# Patient Record
Sex: Female | Born: 1950 | Race: White | Hispanic: No | Smoking: Never smoker
Health system: Southern US, Community
[De-identification: ages and names within clinical notes are randomized; demographics above are authoritative.]

## PROBLEM LIST (undated history)

## (undated) DIAGNOSIS — H269 Unspecified cataract: Secondary | ICD-10-CM

## (undated) DIAGNOSIS — T7840XA Allergy, unspecified, initial encounter: Secondary | ICD-10-CM

## (undated) DIAGNOSIS — M1990 Unspecified osteoarthritis, unspecified site: Secondary | ICD-10-CM

## (undated) HISTORY — PX: CATARACT EXTRACTION: SUR2

## (undated) HISTORY — PX: EYE SURGERY: SHX253

## (undated) HISTORY — PX: OTHER SURGICAL HISTORY: SHX169

## (undated) HISTORY — DX: Allergy, unspecified, initial encounter: T78.40XA

## (undated) HISTORY — DX: Unspecified osteoarthritis, unspecified site: M19.90

## (undated) HISTORY — DX: Unspecified cataract: H26.9

---

## 2010-08-29 ENCOUNTER — Ambulatory Visit: Payer: Self-pay | Admitting: Internal Medicine

## 2012-04-28 ENCOUNTER — Ambulatory Visit: Payer: Self-pay | Admitting: Sports Medicine

## 2015-06-24 ENCOUNTER — Encounter: Payer: Self-pay | Admitting: Family Medicine

## 2015-06-24 ENCOUNTER — Ambulatory Visit (INDEPENDENT_AMBULATORY_CARE_PROVIDER_SITE_OTHER): Payer: 59 | Admitting: Family Medicine

## 2015-06-24 VITALS — BP 130/70 | HR 82 | Ht 67.0 in | Wt 241.0 lb

## 2015-06-24 DIAGNOSIS — H109 Unspecified conjunctivitis: Secondary | ICD-10-CM | POA: Diagnosis not present

## 2015-06-24 DIAGNOSIS — J0100 Acute maxillary sinusitis, unspecified: Secondary | ICD-10-CM

## 2015-06-24 MED ORDER — AMOXICILLIN 500 MG PO CAPS: 500.0000 mg | ORAL_CAPSULE | Freq: Three times a day (TID) | ORAL | Status: DC

## 2015-06-24 MED ORDER — SULFACETAMIDE SODIUM 10 % OP SOLN: 1.0000 [drp] | OPHTHALMIC | Status: DC

## 2015-06-24 NOTE — Progress Notes (Signed)
Name: Miranda Jimenez   MRN: [id number]    DOB: 1951-07-27   Date:06/24/2015       Progress Note  Subjective  Chief Complaint  Chief Complaint  Patient presents with  . Sinusitis    drainage, congestion, green production  . Conjunctivitis    L) eye red and itching- kids had pink eye    Sinusitis This is a recurrent problem. The current episode started more than 1 year ago. The problem has been gradually worsening since onset. The maximum temperature recorded prior to her arrival was 100.4 - 100.9 F. The fever has been present for less than 1 day. Her pain is at a severity of 5/10. The pain is moderate. Associated symptoms include chills, congestion, coughing, headaches, sinus pressure and a sore throat. Pertinent negatives include no diaphoresis, ear pain, hoarse voice, neck pain or shortness of breath. Past treatments include oral decongestants. The treatment provided mild relief.  Conjunctivitis  The current episode started yesterday. The problem occurs frequently. The problem is mild. Associated symptoms include eye itching, congestion, ear discharge, headaches, sore throat, cough and eye redness. Pertinent negatives include no fever, no abdominal pain, no constipation, no diarrhea, no nausea, no ear pain, no neck pain, no wheezing and no rash.    No problem-specific assessment & plan notes found for this encounter.   Past Medical History  Diagnosis Date  . Allergy     Past Surgical History  Procedure Laterality Date  . Cataract extraction Bilateral   . Ptk surgery      History reviewed. No pertinent family history.  Social History   Social History  . Marital Status: Married    Spouse Name: N/A  . Number of Children: N/A  . Years of Education: N/A   Occupational History  . Not on file.   Social History Main Topics  . Smoking status: Never Smoker   . Smokeless tobacco: Not on file  . Alcohol Use: No  . Drug Use: No  . Sexual Activity: No   Other Topics Concern   . Not on file   Social History Narrative  . No narrative on file    No Known Allergies   Review of Systems  Constitutional: Positive for chills. Negative for fever, weight loss, malaise/fatigue and diaphoresis.  HENT: Positive for congestion, ear discharge, sinus pressure and sore throat. Negative for ear pain and hoarse voice.   Eyes: Positive for redness and itching. Negative for blurred vision.  Respiratory: Positive for cough. Negative for sputum production, shortness of breath and wheezing.   Cardiovascular: Negative for chest pain, palpitations and leg swelling.  Gastrointestinal: Negative for heartburn, nausea, abdominal pain, diarrhea, constipation, blood in stool and melena.  Genitourinary: Negative for dysuria, urgency, frequency and hematuria.  Musculoskeletal: Negative for myalgias, back pain, joint pain and neck pain.  Skin: Negative for rash.  Neurological: Positive for headaches. Negative for dizziness, tingling, sensory change and focal weakness.  Endo/Heme/Allergies: Negative for environmental allergies and polydipsia. Does not bruise/bleed easily.  Psychiatric/Behavioral: Negative for depression and suicidal ideas. The patient is not nervous/anxious and does not have insomnia.      Objective  Filed Vitals:   06/24/15 1419  BP: 130/70  Pulse: 82  Height: 5\' 7"  (1.702 m)  Weight: 241 lb (109.317 kg)    Physical Exam  Constitutional: She is well-developed, well-nourished, and in no distress. No distress.  HENT:  Head: Normocephalic and atraumatic.  Right Ear: External ear normal.  Left Ear: External ear normal.  Nose: Nose normal.  Mouth/Throat: Oropharynx is clear and moist.  Eyes: Conjunctivae and EOM are normal. Pupils are equal, round, and reactive to light. Right eye exhibits no discharge. Left eye exhibits no discharge.  Neck: Normal range of motion. Neck supple. No JVD present. No thyromegaly present.  Cardiovascular: Normal rate, regular rhythm,  normal heart sounds and intact distal pulses.  Exam reveals no gallop and no friction rub.   No murmur heard. Pulmonary/Chest: Effort normal and breath sounds normal.  Abdominal: Soft. Bowel sounds are normal. She exhibits no mass. There is no tenderness. There is no guarding.  Musculoskeletal: Normal range of motion. She exhibits no edema.  Lymphadenopathy:    She has no cervical adenopathy.  Neurological: She is alert. She has normal reflexes.  Skin: Skin is warm and dry. She is not diaphoretic.  Psychiatric: Mood and affect normal.      Assessment & Plan  Problem List Items Addressed This Visit    None    Visit Diagnoses    Acute maxillary sinusitis, recurrence not specified    -  Primary    Relevant Medications    amoxicillin (AMOXIL) 500 MG capsule    Bilateral conjunctivitis        Relevant Medications    sulfacetamide (BLEPH-10) 10 % ophthalmic solution         Dr. [facility]den [facility]  06/24/2015

## 2015-08-14 ENCOUNTER — Ambulatory Visit (INDEPENDENT_AMBULATORY_CARE_PROVIDER_SITE_OTHER): Payer: 59 | Admitting: Family Medicine

## 2015-08-14 ENCOUNTER — Encounter: Payer: Self-pay | Admitting: Family Medicine

## 2015-08-14 ENCOUNTER — Ambulatory Visit
Admission: RE | Admit: 2015-08-14 | Discharge: 2015-08-14 | Disposition: A | Discharge status: Home / Self Care | Payer: 59 | Source: Ambulatory Visit | Attending: Family Medicine | Admitting: Family Medicine

## 2015-08-14 VITALS — BP 130/58 | HR 104 | Temp 99.2°F | Ht 67.0 in | Wt 236.0 lb

## 2015-08-14 DIAGNOSIS — I700 Atherosclerosis of aorta: Secondary | ICD-10-CM | POA: Insufficient documentation

## 2015-08-14 DIAGNOSIS — J40 Bronchitis, not specified as acute or chronic: Secondary | ICD-10-CM | POA: Diagnosis not present

## 2015-08-14 DIAGNOSIS — R918 Other nonspecific abnormal finding of lung field: Secondary | ICD-10-CM | POA: Diagnosis not present

## 2015-08-14 DIAGNOSIS — H109 Unspecified conjunctivitis: Secondary | ICD-10-CM | POA: Diagnosis not present

## 2015-08-14 DIAGNOSIS — R05 Cough: Secondary | ICD-10-CM | POA: Insufficient documentation

## 2015-08-14 MED ORDER — LEVOFLOXACIN 500 MG PO TABS: 500.0000 mg | ORAL_TABLET | Freq: Every day | ORAL | Status: DC

## 2015-08-14 MED ORDER — SULFACETAMIDE SODIUM 10 % OP SOLN: 1.0000 [drp] | OPHTHALMIC | Status: DC

## 2015-08-14 MED ORDER — GUAIFENESIN-CODEINE 100-10 MG/5ML PO SOLN: 5.0000 mL | Freq: Three times a day (TID) | ORAL | Status: DC | PRN

## 2015-08-14 NOTE — Progress Notes (Signed)
Name: Miranda Jimenez   MRN: [id number]    DOB: 11/13/1950   Date:08/14/2015       Progress Note  Subjective  Chief Complaint  Chief Complaint  Patient presents with  . Bronchitis    with cough- green production  . Conjunctivitis    R) eye    Conjunctivitis  The problem occurs occasionally. The problem has been gradually worsening. The problem is moderate. Associated symptoms include a fever, eye itching, cough and URI. Pertinent negatives include no orthopnea, no decreased vision, no double vision, no photophobia, no abdominal pain, no constipation, no diarrhea, no nausea, no congestion, no ear discharge, no ear pain, no headaches, no hearing loss, no mouth sores, no rhinorrhea, no sore throat, no stridor, no swollen glands, no neck pain, no neck stiffness, no wheezing, no rash, no eye discharge, no eye pain and no eye redness.  Cough This is a new problem. The current episode started in the past 7 days. The problem has been gradually worsening. The problem occurs hourly. The cough is productive of purulent sputum. Associated symptoms include a fever, nasal congestion, postnasal drip and shortness of breath. Pertinent negatives include no chest pain, chills, ear pain, eye redness, headaches, heartburn, hemoptysis, myalgias, rash, rhinorrhea, sore throat, weight loss or wheezing. She has tried nothing for the symptoms. The treatment provided no relief. There is no history of environmental allergies.    No problem-specific assessment & plan notes found for this encounter.   Past Medical History  Diagnosis Date  . Allergy     Past Surgical History  Procedure Laterality Date  . Cataract extraction Bilateral   . Ptk surgery      No family history on file.  Social History   Social History  . Marital Status: Married    Spouse Name: N/A  . Number of Children: N/A  . Years of Education: N/A   Occupational History  . Not on file.   Social History Main Topics  . Smoking status:  Never Smoker   . Smokeless tobacco: Not on file  . Alcohol Use: No  . Drug Use: No  . Sexual Activity: No   Other Topics Concern  . Not on file   Social History Narrative    No Known Allergies   Review of Systems  Constitutional: Positive for fever. Negative for chills, weight loss and malaise/fatigue.  HENT: Positive for postnasal drip. Negative for congestion, ear discharge, ear pain, hearing loss, mouth sores, rhinorrhea and sore throat.   Eyes: Positive for itching. Negative for blurred vision, double vision, photophobia, pain, discharge and redness.  Respiratory: Positive for cough and shortness of breath. Negative for hemoptysis, sputum production, wheezing and stridor.   Cardiovascular: Negative for chest pain, palpitations, orthopnea and leg swelling.  Gastrointestinal: Negative for heartburn, nausea, abdominal pain, diarrhea, constipation, blood in stool and melena.  Genitourinary: Negative for dysuria, urgency, frequency and hematuria.  Musculoskeletal: Negative for myalgias, back pain, joint pain and neck pain.  Skin: Negative for rash.  Neurological: Negative for dizziness, tingling, sensory change, focal weakness and headaches.  Endo/Heme/Allergies: Negative for environmental allergies and polydipsia. Does not bruise/bleed easily.  Psychiatric/Behavioral: Negative for depression and suicidal ideas. The patient is not nervous/anxious and does not have insomnia.      Objective  Filed Vitals:   08/14/15 1407  BP: 130/58  Pulse: 104  Temp: 99.2 F (37.3 C)  TempSrc: Oral  Height: 5\' 7"  (1.702 m)  Weight: 236 lb (107.049 kg)  SpO2: 95%  Physical Exam  Constitutional: She is well-developed, well-nourished, and in no distress. No distress.  HENT:  Head: Normocephalic and atraumatic.  Right Ear: External ear normal.  Left Ear: External ear normal.  Nose: Nose normal.  Mouth/Throat: Oropharynx is clear and moist.  Eyes: EOM are normal. Pupils are equal,  round, and reactive to light. Right eye exhibits no discharge. Left eye exhibits no discharge. Right conjunctiva is injected. Left conjunctiva is injected.  Neck: Normal range of motion. Neck supple. No JVD present. No thyromegaly present.  Cardiovascular: Normal rate, regular rhythm, normal heart sounds and intact distal pulses.  Exam reveals no gallop and no friction rub.   No murmur heard. Pulmonary/Chest: Effort normal and breath sounds normal.  Abdominal: Soft. Bowel sounds are normal. She exhibits no mass. There is no tenderness. There is no guarding.  Musculoskeletal: Normal range of motion. She exhibits no edema.  Lymphadenopathy:    She has no cervical adenopathy.  Neurological: She is alert. She has normal reflexes.  Skin: Skin is warm and dry. She is not diaphoretic.  Psychiatric: Mood and affect normal.      Assessment & Plan  Problem List Items Addressed This Visit    None    Visit Diagnoses    Bronchitis    -  Primary    Relevant Medications    levofloxacin (LEVAQUIN) 500 MG tablet    guaiFENesin-codeine 100-10 MG/5ML syrup    Other Relevant Orders    DG Chest 2 View    CBC w/Diff/Platelet    Bilateral conjunctivitis        Relevant Medications    sulfacetamide (BLEPH-10) 10 % ophthalmic solution         Dr. [facility]  08/14/2015

## 2015-08-15 LAB — CBC WITH DIFFERENTIAL/PLATELET
BASOS: 1 %
Basophils Absolute: 0.1 10*3/uL (ref 0.0–0.2)
EOS (ABSOLUTE): 0.1 10*3/uL (ref 0.0–0.4)
EOS: 1 %
HEMATOCRIT: 36.9 % (ref 34.0–46.6)
HEMOGLOBIN: 12.4 g/dL (ref 11.1–15.9)
Immature Grans (Abs): 0 10*3/uL (ref 0.0–0.1)
Immature Granulocytes: 0 %
LYMPHS ABS: 2.6 10*3/uL (ref 0.7–3.1)
Lymphs: 24 %
MCH: 31.9 pg (ref 26.6–33.0)
MCHC: 33.6 g/dL (ref 31.5–35.7)
MCV: 95 fL (ref 79–97)
MONOCYTES: 14 %
Monocytes Absolute: 1.5 10*3/uL — ABNORMAL HIGH (ref 0.1–0.9)
NEUTROS ABS: 6.6 10*3/uL (ref 1.4–7.0)
Neutrophils: 60 %
Platelets: 281 10*3/uL (ref 150–379)
RBC: 3.89 x10E6/uL (ref 3.77–5.28)
RDW: 12.8 % (ref 12.3–15.4)
WBC: 10.9 10*3/uL — ABNORMAL HIGH (ref 3.4–10.8)

## 2015-08-18 ENCOUNTER — Ambulatory Visit (INDEPENDENT_AMBULATORY_CARE_PROVIDER_SITE_OTHER): Payer: 59 | Admitting: Family Medicine

## 2015-08-18 ENCOUNTER — Encounter: Payer: Self-pay | Admitting: Family Medicine

## 2015-08-18 VITALS — BP 138/60 | HR 86 | Temp 98.3°F | Ht 67.0 in | Wt 237.0 lb

## 2015-08-18 DIAGNOSIS — J189 Pneumonia, unspecified organism: Secondary | ICD-10-CM | POA: Diagnosis not present

## 2015-08-18 MED ORDER — ALBUTEROL SULFATE (2.5 MG/3ML) 0.083% IN NEBU: 2.5000 mg | INHALATION_SOLUTION | Freq: Once | RESPIRATORY_TRACT | Status: DC

## 2015-08-18 MED ORDER — ALBUTEROL SULFATE HFA 108 (90 BASE) MCG/ACT IN AERS: 2.0000 | INHALATION_SPRAY | Freq: Four times a day (QID) | RESPIRATORY_TRACT | Status: DC | PRN

## 2015-08-18 NOTE — Progress Notes (Addendum)
Name: Miranda Jimenez   MRN: [id number]    DOB: 01/14/1951   Date:08/18/2015       Progress Note  Subjective  Chief Complaint  Chief Complaint  Patient presents with  . Pneumonia    follow up    Pneumonia There is no chest tightness, cough, difficulty breathing, frequent throat clearing, hemoptysis, hoarse voice, shortness of breath, sputum production or wheezing. This is a chronic problem. The current episode started more than 1 year ago. The problem occurs daily. The problem has been waxing and waning. Pertinent negatives include no chest pain, dyspnea on exertion, ear congestion, ear pain, fever, headaches, heartburn, malaise/fatigue, myalgias, nasal congestion, PND, postnasal drip, rhinorrhea, sore throat or weight loss. Her symptoms are alleviated by beta-agonist. She reports moderate improvement on treatment. There are no known risk factors for lung disease. There is no history of asthma, bronchiectasis, bronchitis, COPD, emphysema or pneumonia.    No problem-specific assessment & plan notes found for this encounter.   Past Medical History  Diagnosis Date  . Allergy     Past Surgical History  Procedure Laterality Date  . Cataract extraction Bilateral   . Ptk surgery      History reviewed. No pertinent family history.  Social History   Social History  . Marital Status: Married    Spouse Name: N/A  . Number of Children: N/A  . Years of Education: N/A   Occupational History  . Not on file.   Social History Main Topics  . Smoking status: Never Smoker   . Smokeless tobacco: Not on file  . Alcohol Use: No  . Drug Use: No  . Sexual Activity: No   Other Topics Concern  . Not on file   Social History Narrative    No Known Allergies   Review of Systems  Constitutional: Negative for fever, chills, weight loss and malaise/fatigue.  HENT: Negative for ear discharge, ear pain, hoarse voice, postnasal drip, rhinorrhea and sore throat.   Eyes: Negative for blurred  vision.  Respiratory: Negative for cough, hemoptysis, sputum production, shortness of breath and wheezing.   Cardiovascular: Negative for chest pain, dyspnea on exertion, palpitations, leg swelling and PND.  Gastrointestinal: Negative for heartburn, nausea, abdominal pain, diarrhea, constipation, blood in stool and melena.  Genitourinary: Negative for dysuria, urgency, frequency and hematuria.  Musculoskeletal: Negative for myalgias, back pain, joint pain and neck pain.  Skin: Negative for rash.  Neurological: Negative for dizziness, tingling, sensory change, focal weakness and headaches.  Endo/Heme/Allergies: Negative for environmental allergies and polydipsia. Does not bruise/bleed easily.  Psychiatric/Behavioral: Negative for depression and suicidal ideas. The patient is not nervous/anxious and does not have insomnia.      Objective  Filed Vitals:   08/18/15 0808  BP: 138/60  Pulse: 86  Temp: 98.3 F (36.8 C)  TempSrc: Oral  Height: 5\' 7"  (1.702 m)  Weight: 237 lb (107.502 kg)  SpO2: 97%    Physical Exam  Constitutional: She is well-developed, well-nourished, and in no distress. No distress.  HENT:  Head: Normocephalic and atraumatic.  Right Ear: External ear normal. A middle ear effusion is present.  Left Ear: External ear normal. A middle ear effusion is present.  Nose: Nose normal.  Mouth/Throat: Oropharynx is clear and moist.  Eyes: Conjunctivae and EOM are normal. Pupils are equal, round, and reactive to light. Right eye exhibits no discharge. Left eye exhibits no discharge.  Neck: Normal range of motion. Neck supple. No JVD present. No thyromegaly present.  Cardiovascular:  Normal rate, regular rhythm, normal heart sounds and intact distal pulses.  Exam reveals no gallop and no friction rub.   No murmur heard. Pulmonary/Chest: Effort normal and breath sounds normal.  Abdominal: Soft. Bowel sounds are normal. She exhibits no mass. There is no tenderness. There is no  guarding.  Musculoskeletal: Normal range of motion. She exhibits no edema.  Lymphadenopathy:    She has no cervical adenopathy.  Neurological: She is alert. She has normal reflexes.  Skin: Skin is warm and dry. She is not diaphoretic.  Psychiatric: Mood and affect normal.      Assessment & Plan  Problem List Items Addressed This Visit    None    Visit Diagnoses    Pneumonia, unspecified laterality, unspecified part of lung    -  Primary    Brea sample    Relevant Medications    albuterol (PROVENTIL HFA;VENTOLIN HFA) 108 (90 BASE) MCG/ACT inhaler    albuterol (PROVENTIL) (2.5 MG/3ML) 0.083% nebulizer solution 2.5 mg         Dr. [facility]  08/18/2015

## 2015-10-03 ENCOUNTER — Ambulatory Visit (INDEPENDENT_AMBULATORY_CARE_PROVIDER_SITE_OTHER): Payer: 59 | Admitting: Family Medicine

## 2015-10-03 ENCOUNTER — Encounter: Payer: Self-pay | Admitting: Family Medicine

## 2015-10-03 VITALS — BP 138/80 | HR 92 | Temp 98.2°F | Ht 67.0 in | Wt 236.0 lb

## 2015-10-03 DIAGNOSIS — J0100 Acute maxillary sinusitis, unspecified: Secondary | ICD-10-CM

## 2015-10-03 DIAGNOSIS — J40 Bronchitis, not specified as acute or chronic: Secondary | ICD-10-CM | POA: Diagnosis not present

## 2015-10-03 MED ORDER — GUAIFENESIN-CODEINE 100-10 MG/5ML PO SOLN: 5.0000 mL | Freq: Three times a day (TID) | ORAL | Status: DC | PRN

## 2015-10-03 MED ORDER — AMOXICILLIN 500 MG PO CAPS: 500.0000 mg | ORAL_CAPSULE | Freq: Three times a day (TID) | ORAL | Status: DC

## 2015-10-03 NOTE — Progress Notes (Signed)
Name: Artist PaisVerna D Bolinger   MRN: [id number]    DOB: 06/20/1951   Date:10/03/2015       Progress Note  Subjective  Chief Complaint  Chief Complaint  Patient presents with  . Sinusitis    cough and cong- yellow production    Sinusitis This is a new problem. The current episode started in the past 7 days. The problem has been gradually worsening since onset. There has been no fever. Her pain is at a severity of 3/10. Associated symptoms include chills, congestion, coughing, diaphoresis, sneezing and a sore throat. Pertinent negatives include no ear pain, headaches, hoarse voice, neck pain, shortness of breath, sinus pressure or swollen glands. Past treatments include acetaminophen and oral decongestants. The treatment provided no relief.  Cough This is a new problem. The current episode started in the past 7 days. The problem has been waxing and waning. The cough is productive of purulent sputum. Associated symptoms include chills, nasal congestion, postnasal drip, a sore throat and wheezing. Pertinent negatives include no chest pain, ear pain, fever, headaches, heartburn, myalgias, rash, shortness of breath or weight loss. She has tried a beta-agonist inhaler for the symptoms. The treatment provided mild relief. There is no history of environmental allergies.    No problem-specific assessment & plan notes found for this encounter.   Past Medical History  Diagnosis Date  . Allergy     Past Surgical History  Procedure Laterality Date  . Cataract extraction Bilateral   . Ptk surgery      History reviewed. No pertinent family history.  Social History   Social History  . Marital Status: Married    Spouse Name: N/A  . Number of Children: N/A  . Years of Education: N/A   Occupational History  . Not on file.   Social History Main Topics  . Smoking status: Never Smoker   . Smokeless tobacco: Not on file  . Alcohol Use: No  . Drug Use: No  . Sexual Activity: No   Other Topics  Concern  . Not on file   Social History Narrative    No Known Allergies   Review of Systems  Constitutional: Positive for chills and diaphoresis. Negative for fever, weight loss and malaise/fatigue.  HENT: Positive for congestion, postnasal drip, sneezing and sore throat. Negative for ear discharge, ear pain, hoarse voice and sinus pressure.   Eyes: Negative for blurred vision.  Respiratory: Positive for cough and wheezing. Negative for sputum production and shortness of breath.   Cardiovascular: Negative for chest pain, palpitations and leg swelling.  Gastrointestinal: Negative for heartburn, nausea, abdominal pain, diarrhea, constipation, blood in stool and melena.  Genitourinary: Negative for dysuria, urgency, frequency and hematuria.  Musculoskeletal: Negative for myalgias, back pain, joint pain and neck pain.  Skin: Negative for rash.  Neurological: Negative for dizziness, tingling, sensory change, focal weakness and headaches.  Endo/Heme/Allergies: Negative for environmental allergies and polydipsia. Does not bruise/bleed easily.  Psychiatric/Behavioral: Negative for depression and suicidal ideas. The patient is not nervous/anxious and does not have insomnia.      Objective  Filed Vitals:   10/03/15 1132  BP: 138/80  Pulse: 92  Temp: 98.2 F (36.8 C)  TempSrc: Oral  Height: 5\' 7"  (1.702 m)  Weight: 236 lb (107.049 kg)  SpO2: 98%    Physical Exam  Constitutional: She is well-developed, well-nourished, and in no distress. No distress.  HENT:  Head: Normocephalic and atraumatic.  Right Ear: External ear normal.  Left Ear: External ear normal.  Nose: Nose normal.  Mouth/Throat: Oropharynx is clear and moist.  Eyes: Conjunctivae and EOM are normal. Pupils are equal, round, and reactive to light. Right eye exhibits no discharge. Left eye exhibits no discharge.  Neck: Normal range of motion. Neck supple. No JVD present. No thyromegaly present.  Cardiovascular: Normal  rate, regular rhythm, normal heart sounds and intact distal pulses.  Exam reveals no gallop and no friction rub.   No murmur heard. Pulmonary/Chest: Effort normal and breath sounds normal. She has no wheezes. She has no rales.  Abdominal: Soft. Bowel sounds are normal. She exhibits no mass. There is no tenderness. There is no guarding.  Musculoskeletal: Normal range of motion. She exhibits no edema.  Lymphadenopathy:    She has no cervical adenopathy.  Neurological: She is alert. She has normal reflexes.  Skin: Skin is warm and dry. She is not diaphoretic.  Psychiatric: Mood and affect normal.      Assessment & Plan  Problem List Items Addressed This Visit    None    Visit Diagnoses    Acute maxillary sinusitis, recurrence not specified    -  Primary    Relevant Medications    amoxicillin (AMOXIL) 500 MG capsule    guaiFENesin-codeine 100-10 MG/5ML syrup    Bronchitis        Relevant Medications    amoxicillin (AMOXIL) 500 MG capsule    guaiFENesin-codeine 100-10 MG/5ML syrup         Dr. [facility]  10/03/2015

## 2015-10-17 ENCOUNTER — Other Ambulatory Visit: Payer: Self-pay

## 2015-10-17 DIAGNOSIS — B379 Candidiasis, unspecified: Secondary | ICD-10-CM

## 2015-10-17 MED ORDER — FLUCONAZOLE 150 MG PO TABS: 150.0000 mg | ORAL_TABLET | Freq: Once | ORAL | Status: DC

## 2015-11-16 ENCOUNTER — Encounter: Payer: Self-pay | Admitting: Gynecology

## 2015-11-16 ENCOUNTER — Ambulatory Visit
Admission: EM | Admit: 2015-11-16 | Discharge: 2015-11-16 | Disposition: A | Discharge status: Home / Self Care | Payer: 59 | Attending: Family Medicine | Admitting: Family Medicine

## 2015-11-16 ENCOUNTER — Ambulatory Visit (INDEPENDENT_AMBULATORY_CARE_PROVIDER_SITE_OTHER): Payer: 59

## 2015-11-16 DIAGNOSIS — H65191 Other acute nonsuppurative otitis media, right ear: Secondary | ICD-10-CM

## 2015-11-16 DIAGNOSIS — J029 Acute pharyngitis, unspecified: Secondary | ICD-10-CM | POA: Diagnosis not present

## 2015-11-16 DIAGNOSIS — J069 Acute upper respiratory infection, unspecified: Secondary | ICD-10-CM | POA: Diagnosis not present

## 2015-11-16 MED ORDER — PREDNISONE 20 MG PO TABS: ORAL_TABLET | ORAL | Status: DC

## 2015-11-16 MED ORDER — LEVALBUTEROL HCL 1.25 MG/0.5ML IN NEBU
1.2500 mg | INHALATION_SOLUTION | Freq: Once | RESPIRATORY_TRACT | Status: AC
  Administered 2015-11-16: 1.25 mg via RESPIRATORY_TRACT

## 2015-11-16 MED ORDER — HYDROCOD POLST-CPM POLST ER 10-8 MG/5ML PO SUER: 5.0000 mL | Freq: Two times a day (BID) | ORAL | Status: DC | PRN

## 2015-11-16 NOTE — ED Provider Notes (Addendum)
CSN: [id number]     Arrival date & time 11/16/15  1558 History   First MD Initiated Contact with Patient 11/16/15 1647     Chief Complaint  Patient presents with  . Cough   (Consider location/radiation/quality/duration/timing/severity/associated sxs/prior Treatment) HPI: Patient complains of cough for the last 2 weeks. Patient was prescribed prednisone 6 days and Levaquin 7 days. Patient still states that her coughing has not improved much after taking the meds. Patient denies any chest pain or increased shortness of breath. Patient denies any calf tenderness or swelling. Other people in the household also have similar symptoms. She does have an albuterol inhaler but has not used it. She has been using some over-the-counter counter cough medicine with little improvement. She denies smoking history or asthma history.  Past Medical History  Diagnosis Date  . Allergy    Past Surgical History  Procedure Laterality Date  . Cataract extraction Bilateral   . Ptk surgery     No family history on file. Social History  Substance Use Topics  . Smoking status: Never Smoker   . Smokeless tobacco: None  . Alcohol Use: No   OB History    No data available     Review of Systems: Negative except mentioned above.   Allergies  Review of patient's allergies indicates no known allergies.  Home Medications   Prior to Admission medications   Medication Sig Start Date End Date Taking? Authorizing Provider  albuterol (PROVENTIL HFA;VENTOLIN HFA) 108 (90 BASE) MCG/ACT inhaler Inhale 2 puffs into the lungs every 6 (six) hours as needed for wheezing or shortness of breath. 08/18/15  Yes [name], MD  fluconazole (DIFLUCAN) 150 MG tablet Take 1 tablet (150 mg total) by mouth once. 10/17/15  Yes [name], MD  amoxicillin (AMOXIL) 500 MG capsule Take 1 capsule (500 mg total) by mouth 3 (three) times daily. 10/03/15   [name], MD  guaiFENesin-codeine 100-10 MG/5ML syrup Take 5 mLs by mouth  3 (three) times daily as needed. 10/03/15   [name], MD   Meds Ordered and Administered this Visit  Medications - No data to display  BP 132/72 mmHg  Pulse 87  Temp(Src) 97.8 F (36.6 C) (Oral)  Resp 16  Ht  (1.702 m)  Wt 235 lb (106.595 kg)  BMI 36.80 kg/m2  SpO2 94% No data found.    Physical Exam   GENERAL: NAD HEENT: no pharyngeal erythema, no exudate, no erythema of TMs, no cervical LAD RESP: CTA B, no accessory muscle use CARD: RRR NEURO: CN II-XII grossly intact   ED Course  Procedures (including critical care time)  Labs Review Labs Reviewed - No data to display  Imaging Review No results found.   MDM  URI- Chest x-ray was done which does not show any acute process, patient is to take her last dose of Levaquin and prednisone, Xopenex treatment was given to patient here in the office with some improvement and pulse ox came up to 96%, recommend that patient follow up with her primary care physician if symptoms persist or worsen, will prescribe her Tussionex when necessary for cough, discussed risk of drowsiness, would also try albuterol inhaler as needed and oral antihistamine daily, will prescribe a few more days of oral Prednisone as well.    Jolene Provost, MD 11/16/15 1728  Jolene Provost, MD 11/16/15 [phone]

## 2015-11-16 NOTE — Discharge Instructions (Signed)
Take cough medication as needed. Continue Claritin or Zyrtec. Try Albuterol Inhaler as needed. Seek medical attention if symptoms persist/worsen.

## 2015-11-16 NOTE — ED Notes (Signed)
Patient c/o cough for about 2 weeks now. Per patient call MD online x 6 days ago and was given Rx. Prednisone x 6 days and Levaquin x 7 days. Per patient coughing not getting any better.

## 2016-08-31 ENCOUNTER — Other Ambulatory Visit: Payer: Self-pay

## 2016-09-01 ENCOUNTER — Ambulatory Visit (INDEPENDENT_AMBULATORY_CARE_PROVIDER_SITE_OTHER): Payer: 59 | Admitting: Family Medicine

## 2016-09-01 ENCOUNTER — Encounter: Payer: Self-pay | Admitting: Family Medicine

## 2016-09-01 VITALS — BP 120/74 | HR 84 | Ht 67.0 in | Wt 240.0 lb

## 2016-09-01 DIAGNOSIS — H1033 Unspecified acute conjunctivitis, bilateral: Secondary | ICD-10-CM

## 2016-09-01 DIAGNOSIS — H6503 Acute serous otitis media, bilateral: Secondary | ICD-10-CM | POA: Diagnosis not present

## 2016-09-01 DIAGNOSIS — B001 Herpesviral vesicular dermatitis: Secondary | ICD-10-CM | POA: Diagnosis not present

## 2016-09-01 MED ORDER — SULFACETAMIDE SODIUM 10 % OP SOLN: 1.0000 [drp] | OPHTHALMIC | 1 refills | Status: DC

## 2016-09-01 MED ORDER — SULFACETAMIDE SODIUM 10 % OP SOLN: 1.0000 [drp] | OPHTHALMIC | Status: DC

## 2016-09-01 NOTE — Progress Notes (Signed)
Name: Miranda Jimenez   MRN: [id number]    DOB: 01/18/1951   Date:09/01/2016       Progress Note  Subjective  Chief Complaint  Chief Complaint  Patient presents with  . Conjunctivitis    woke up Tuesday am with matted eye- itchy and red L) eye    Conjunctivitis   The current episode started 2 days ago. The onset was gradual. The problem has been gradually worsening. The problem is mild. Associated symptoms include eye itching, eye discharge and eye redness. Pertinent negatives include no fever, no decreased vision, no double vision, no photophobia, no abdominal pain, no constipation, no diarrhea, no nausea, no congestion, no ear discharge, no ear pain, no headaches, no hearing loss, no mouth sores, no rhinorrhea, no sore throat, no stridor, no swollen glands, no neck pain, no cough, no wheezing, no rash and no eye pain.    No problem-specific Assessment & Plan notes found for this encounter.   Past Medical History:  Diagnosis Date  . Allergy     Past Surgical History:  Procedure Laterality Date  . CATARACT EXTRACTION Bilateral   . ptk surgery      History reviewed. No pertinent family history.  Social History   Social History  . Marital status: Married    Spouse name: N/A  . Number of children: N/A  . Years of education: N/A   Occupational History  . Not on file.   Social History Main Topics  . Smoking status: Never Smoker  . Smokeless tobacco: Not on file  . Alcohol use No  . Drug use: No  . Sexual activity: No   Other Topics Concern  . Not on file   Social History Narrative  . No narrative on file    No Known Allergies   Review of Systems  Constitutional: Negative for chills, fever, malaise/fatigue and weight loss.  HENT: Negative for congestion, ear discharge, ear pain, hearing loss, mouth sores, rhinorrhea and sore throat.   Eyes: Positive for discharge, redness and itching. Negative for blurred vision, double vision, photophobia and pain.   Respiratory: Negative for cough, sputum production, shortness of breath, wheezing and stridor.   Cardiovascular: Negative for chest pain, palpitations and leg swelling.  Gastrointestinal: Negative for abdominal pain, blood in stool, constipation, diarrhea, heartburn, melena and nausea.  Genitourinary: Negative for dysuria, frequency, hematuria and urgency.  Musculoskeletal: Negative for back pain, joint pain, myalgias and neck pain.  Skin: Negative for rash.  Neurological: Negative for dizziness, tingling, sensory change, focal weakness and headaches.  Endo/Heme/Allergies: Negative for environmental allergies and polydipsia. Does not bruise/bleed easily.  Psychiatric/Behavioral: Negative for depression and suicidal ideas. The patient is not nervous/anxious and does not have insomnia.      Objective  Vitals:   09/01/16 0830  BP: 120/74  Pulse: 84  Weight: 240 lb (108.9 kg)  Height: 5\' 7"  (1.702 m)    Physical Exam  Constitutional: She is well-developed, well-nourished, and in no distress. No distress.  HENT:  Head: Normocephalic and atraumatic.  Right Ear: External ear and ear canal normal. A middle ear effusion is present.  Left Ear: External ear and ear canal normal. A middle ear effusion is present.  Nose: Nose normal.  Mouth/Throat: Oropharynx is clear and moist.  Eyes: EOM are normal. Pupils are equal, round, and reactive to light. Right eye exhibits discharge. Left eye exhibits discharge. Right conjunctiva is injected. Left conjunctiva is injected.  Neck: Normal range of motion. Neck supple. No JVD  present. No thyromegaly present.  Cardiovascular: Normal rate, regular rhythm, normal heart sounds and intact distal pulses.  Exam reveals no gallop and no friction rub.   No murmur heard. Pulmonary/Chest: Effort normal and breath sounds normal. She has no wheezes. She has no rales.  Abdominal: Soft. Bowel sounds are normal. She exhibits no mass. There is no tenderness. There is  no guarding.  Musculoskeletal: Normal range of motion. She exhibits no edema.  Lymphadenopathy:    She has no cervical adenopathy.  Neurological: She is alert. She has normal reflexes.  Skin: Skin is warm and dry. She is not diaphoretic.  Psychiatric: Mood and affect normal.  Nursing note and vitals reviewed.     Assessment & Plan  Problem List Items Addressed This Visit    None    Visit Diagnoses    Acute conjunctivitis of both eyes, unspecified acute conjunctivitis type    -  Primary   Relevant Medications   sulfacetamide (BLEPH-10) 10 % ophthalmic solution 1 drop   Bilateral acute serous otitis media, recurrence not specified       mucinex d   Herpes labialis            Dr. Hayden Rasmusseneanna Jones [facility]  09/01/16

## 2016-09-01 NOTE — Patient Instructions (Signed)

## 2016-09-01 NOTE — Addendum Note (Signed)
Addended by: Everitt AmberLYNCH, TARA L on: 09/01/2016 09:22 AM   Modules accepted: Orders

## 2018-01-17 DIAGNOSIS — R69 Illness, unspecified: Secondary | ICD-10-CM | POA: Diagnosis not present

## 2018-02-18 ENCOUNTER — Ambulatory Visit
Admission: EM | Admit: 2018-02-18 | Discharge: 2018-02-18 | Disposition: A | Discharge status: Home / Self Care | Payer: Medicare HMO | Attending: Emergency Medicine | Admitting: Emergency Medicine

## 2018-02-18 ENCOUNTER — Other Ambulatory Visit: Payer: Self-pay

## 2018-02-18 ENCOUNTER — Ambulatory Visit (INDEPENDENT_AMBULATORY_CARE_PROVIDER_SITE_OTHER): Payer: Medicare HMO

## 2018-02-18 ENCOUNTER — Encounter: Payer: Self-pay | Admitting: Gynecology

## 2018-02-18 DIAGNOSIS — M25562 Pain in left knee: Secondary | ICD-10-CM

## 2018-02-18 DIAGNOSIS — M1712 Unilateral primary osteoarthritis, left knee: Secondary | ICD-10-CM | POA: Diagnosis not present

## 2018-02-18 DIAGNOSIS — M7052 Other bursitis of knee, left knee: Secondary | ICD-10-CM | POA: Diagnosis not present

## 2018-02-18 MED ORDER — NAPROXEN 500 MG PO TABS: 500.0000 mg | ORAL_TABLET | Freq: Two times a day (BID) | ORAL | 0 refills | Status: DC

## 2018-02-18 NOTE — ED Triage Notes (Signed)
Patient c/o left knee pain x 3 weeks ago

## 2018-02-18 NOTE — ED Provider Notes (Signed)
[facility]    CSN: [id number] Arrival date & time: 02/18/18  1222     History   Chief Complaint Chief Complaint  Patient presents with  . Knee Pain    HPI Miranda Jimenez is a 67 y.o. female.   HPI  67 year old female presents with left knee pain started about 3 weeks ago.  She states she was in her garden pulling bags of mulch requiring pulling and kneeling.  She states that the inflamed area mostly in the medial aspect of her knee.  It was slowly improving until today when she was going downstairs taking her body weight against gravity she had severe pain under her kneecap that has persisted.  Taking ibuprofen using ice with some degree of success.  She denies any locking popping or clicking.  Never had a previous injury to that knee.  All injuries were of  of her right knee .No Noticed  swelling.       Past Medical History:  Diagnosis Date  . Allergy     There are no active problems to display for this patient.   Past Surgical History:  Procedure Laterality Date  . CATARACT EXTRACTION Bilateral   . ptk surgery      OB History   None      Home Medications    Prior to Admission medications   Medication Sig Start Date End Date Taking? Authorizing Provider  naproxen (NAPROSYN) 500 MG tablet Take 1 tablet (500 mg total) by mouth 2 (two) times daily with a meal. 02/18/18   Lutricia Feil, PA-C    Family History Family History  Problem Relation Age of Onset  . Cancer Mother        lung  . Cancer Father        colon    Social History Social History   Tobacco Use  . Smoking status: Never Smoker  . Smokeless tobacco: Never Used  Substance Use Topics  . Alcohol use: No    Alcohol/week: 0.0 oz  . Drug use: No     Allergies   Patient has no known allergies.   Review of Systems Review of Systems  Constitutional: Positive for activity change. Negative for appetite change, chills, fatigue and fever.  Musculoskeletal: Positive for  gait problem and myalgias.  All other systems reviewed and are negative.    Physical Exam Triage Vital Signs ED Triage Vitals  Enc Vitals Group     BP 02/18/18 1244 (!) 137/53     Pulse Rate 02/18/18 1244 82     Resp 02/18/18 1244 18     Temp 02/18/18 1244 98.5 F (36.9 C)     Temp Source 02/18/18 1244 Oral     SpO2 02/18/18 1244 97 %     Weight 02/18/18 1245 250 lb (113.4 kg)     Height 02/18/18 1245  (1.702 m)     Head Circumference --      Peak Flow --      Pain Score 02/18/18 1242 9     Pain Loc --      Pain Edu? --      Excl. in GC? --    No data found.  Updated Vital Signs BP (!) 137/53 (BP Location: Right Arm)   Pulse 82   Temp 98.5 F (36.9 C) (Oral)   Resp 18   Ht  (1.702 m)   Wt 250 lb (113.4 kg)   SpO2 97%   BMI 39.16  kg/m   Visual Acuity Right Eye Distance:   Left Eye Distance:   Bilateral Distance:    Right Eye Near:   Left Eye Near:    Bilateral Near:     Physical Exam  Constitutional: She is oriented to person, place, and time. She appears well-developed and well-nourished. No distress.  HENT:  Head: Normocephalic.  Eyes: Pupils are equal, round, and reactive to light. Right eye exhibits no discharge. Left eye exhibits no discharge.  Neck: Normal range of motion.  Musculoskeletal: She exhibits tenderness. She exhibits no deformity.  Examination of the left knee shows no effusion.  Vastus medialis is soft with control limited by discomfort.  Very mild patellofemoral pain to test.  There is no significant retropatellar tenderness present.  There is no tenderness over the lateral joint line.  Medial and lateral collateral ligaments are intact to clinical stressing without discomfort.  Maximal tenderness is over the pes anserinus bursa medially.  This reproduces her pain.  Neurological: She is alert and oriented to person, place, and time.  Skin: Skin is warm and dry. She is not diaphoretic.  Psychiatric: She has a normal mood and affect.  Her behavior is normal. Judgment and thought content normal.  Nursing note and vitals reviewed.    UC Treatments / Results  Labs (all labs ordered are listed, but only abnormal results are displayed) Labs Reviewed - No data to display  EKG None  Radiology Dg Knee Complete 4 Views Left  Result Date: 02/18/2018 CLINICAL DATA:  Pain in left knee after doing yard work last week. Pt heard a "pop" today and know has diffuse pain especially around patella. EXAM: LEFT KNEE - COMPLETE 4+ VIEW COMPARISON:  None. FINDINGS: There is mild degenerative change involving the patellofemoral compartment. No joint effusion. No acute fracture. IMPRESSION: No evidence for acute  abnormality.  Mild degenerative changes. Electronically Signed   By: Norva Pavlov M.D.   On: 02/18/2018 13:57    Procedures Procedures (including critical care time)  Medications Ordered in UC Medications - No data to display  Initial Impression / Assessment and Plan / UC Course  I have reviewed the triage vital signs and the nursing notes.  Pertinent labs & imaging results that were available during my care of the patient were reviewed by me and considered in my medical decision making (see chart for details).     Plan: 1. Test/x-ray results and diagnosis reviewed with patient 2. rx as per orders; risks, benefits, potential side effects reviewed with patient 3. Recommend supportive treatment with rest and symptom avoidance. She was placed into a knee immobilizer for stability.  Use ice 20 minutes out of every 2 hours 4-5 times daily.  Place her on Naprosyn for anti-inflammatory effect.  If her symptoms persist for 1 to 2 weeks she should be reevaluated by orthopedic surgery.  I reviewed her x-rays which showed mainly mild PF joint space narrowing. 4. F/u prn if symptoms worsen or don't improve  Final Clinical Impressions(s) / UC Diagnoses   Final diagnoses:  Pes anserinus bursitis of left knee     Discharge  Instructions     Apply ice 20 minutes out of every 2 hours 4-5 times daily for comfort.     ED Prescriptions    Medication Sig Dispense Auth. Provider   naproxen (NAPROSYN) 500 MG tablet Take 1 tablet (500 mg total) by mouth 2 (two) times daily with a meal. 60 tablet Lutricia Feil, PA-C  Controlled Substance Prescriptions [place] Controlled Substance Registry consulted? Not Applicable   Lutricia Feil, PA-C 02/18/18 1431

## 2018-02-18 NOTE — Discharge Instructions (Signed)
Apply ice 20 minutes out of every 2 hours 4-5 times daily for comfort.  °

## 2018-03-10 ENCOUNTER — Encounter: Payer: Self-pay | Admitting: Family Medicine

## 2018-03-10 ENCOUNTER — Ambulatory Visit (INDEPENDENT_AMBULATORY_CARE_PROVIDER_SITE_OTHER): Payer: Medicare HMO | Admitting: Family Medicine

## 2018-03-10 VITALS — BP 130/62 | HR 80 | Ht 67.0 in | Wt 261.0 lb

## 2018-03-10 DIAGNOSIS — M23304 Other meniscus derangements, unspecified medial meniscus, left knee: Secondary | ICD-10-CM | POA: Diagnosis not present

## 2018-03-10 MED ORDER — NAPROXEN 500 MG PO TABS: 500.0000 mg | ORAL_TABLET | Freq: Two times a day (BID) | ORAL | 0 refills | Status: DC

## 2018-03-10 NOTE — Progress Notes (Signed)
Name: Miranda Jimenez   MRN: [id number]    DOB: 13-Nov-1950   Date:03/10/2018       Progress Note  Subjective  Chief Complaint  Chief Complaint  Patient presents with  . Follow-up    urgent care- L) knee pain/ been taking Naproxen- started after working in the yard with mulch and stepped down off step    Knee Pain   The incident occurred more than 1 week ago (3 weeks ago). The incident occurred at home. Injury mechanism: out doing yardwork. The pain is present in the left knee. The quality of the pain is described as aching. The pain is at a severity of 3/10. The pain is mild. The pain has been improving (" gotten alot better") since onset. Pertinent negatives include no inability to bear weight, loss of motion, loss of sensation, muscle weakness, numbness or tingling. The symptoms are aggravated by movement and weight bearing. She has tried NSAIDs and immobilization (out of brace) for the symptoms. The treatment provided moderate relief.    No problem-specific Assessment & Plan notes found for this encounter.   Past Medical History:  Diagnosis Date  . Allergy     Past Surgical History:  Procedure Laterality Date  . CATARACT EXTRACTION Bilateral   . ptk surgery      Family History  Problem Relation Age of Onset  . Cancer Mother        lung  . Cancer Father        colon    Social History   Socioeconomic History  . Marital status: Married    Spouse name: Not on file  . Number of children: Not on file  . Years of education: Not on file  . Highest education level: Not on file  Occupational History  . Not on file  Social Needs  . Financial resource strain: Not on file  . Food insecurity:    Worry: Not on file    Inability: Not on file  . Transportation needs:    Medical: Not on file    Non-medical: Not on file  Tobacco Use  . Smoking status: Never Smoker  . Smokeless tobacco: Never Used  Substance and Sexual Activity  . Alcohol use: No    Alcohol/week: 0.0 oz   . Drug use: No  . Sexual activity: Never  Lifestyle  . Physical activity:    Days per week: Not on file    Minutes per session: Not on file  . Stress: Not on file  Relationships  . Social connections:    Talks on phone: Not on file    Gets together: Not on file    Attends religious service: Not on file    Active member of club or organization: Not on file    Attends meetings of clubs or organizations: Not on file    Relationship status: Not on file  . Intimate partner violence:    Fear of current or ex partner: Not on file    Emotionally abused: Not on file    Physically abused: Not on file    Forced sexual activity: Not on file  Other Topics Concern  . Not on file  Social History Narrative  . Not on file    No Known Allergies  Outpatient Medications Prior to Visit  Medication Sig Dispense Refill  . naproxen (NAPROSYN) 500 MG tablet Take 1 tablet (500 mg total) by mouth 2 (two) times daily with a meal. 60 tablet 0   Facility-Administered  Medications Prior to Visit  Medication Dose Route Frequency Provider Last Rate Last Dose  . albuterol (PROVENTIL) (2.5 MG/3ML) 0.083% nebulizer solution 2.5 mg  2.5 mg Nebulization Once Donney Caraveo C, MD      . sulfacetamide (BLEPH-10) 10 % ophthalmic solution 1 drop  1 drop Both Eyes Q4H Younique Casad C, MD        Review of Systems  Constitutional: Negative for chills, fever, malaise/fatigue and weight loss.  HENT: Negative for ear discharge, ear pain and sore throat.   Eyes: Negative for blurred vision.  Respiratory: Negative for cough, sputum production, shortness of breath and wheezing.   Cardiovascular: Negative for chest pain, palpitations and leg swelling.  Gastrointestinal: Negative for abdominal pain, blood in stool, constipation, diarrhea, heartburn, melena and nausea.  Genitourinary: Negative for dysuria, frequency, hematuria and urgency.  Musculoskeletal: Negative for back pain, joint pain, myalgias and neck pain.  Skin:  Negative for rash.  Neurological: Negative for dizziness, tingling, sensory change, focal weakness, numbness and headaches.  Endo/Heme/Allergies: Negative for environmental allergies and polydipsia. Does not bruise/bleed easily.  Psychiatric/Behavioral: Negative for depression and suicidal ideas. The patient is not nervous/anxious and does not have insomnia.      Objective  Vitals:   03/10/18 1351  BP: 130/62  Pulse: 80  Weight: 261 lb (118.4 kg)  Height:  (1.702 m)    Physical Exam  Constitutional: She is oriented to person, place, and time. She appears well-developed and well-nourished.  HENT:  Head: Normocephalic.  Right Ear: External ear normal.  Left Ear: External ear normal.  Mouth/Throat: Oropharynx is clear and moist.  Eyes: Pupils are equal, round, and reactive to light. Conjunctivae and EOM are normal. Lids are everted and swept, no foreign bodies found. Left eye exhibits no hordeolum. No foreign body present in the left eye. Right conjunctiva is not injected. Left conjunctiva is not injected. No scleral icterus.  Neck: Normal range of motion. Neck supple. No JVD present. No tracheal deviation present. No thyromegaly present.  Cardiovascular: Normal rate, regular rhythm, normal heart sounds and intact distal pulses. Exam reveals no gallop and no friction rub.  No murmur heard. Pulmonary/Chest: Effort normal and breath sounds normal. No respiratory distress. She has no wheezes. She has no rales.  Abdominal: Soft. Bowel sounds are normal. She exhibits no mass. There is no hepatosplenomegaly. There is no tenderness. There is no rebound and no guarding.  Musculoskeletal: Normal range of motion. She exhibits no edema.       Left knee: She exhibits normal patellar mobility, no bony tenderness, normal meniscus and no MCL laxity. Tenderness found. Medial joint line and lateral joint line tenderness noted. No MCL, no LCL and no patellar tendon tenderness noted.  Lymphadenopathy:     She has no cervical adenopathy.  Neurological: She is alert and oriented to person, place, and time. She has normal strength. She displays normal reflexes. No cranial nerve deficit.  Skin: Skin is warm. No rash noted.  Psychiatric: She has a normal mood and affect. Her mood appears not anxious. She does not exhibit a depressed mood.  Nursing note and vitals reviewed.     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 due to persistent pain will refer to orthopedics   Relevant Medications   naproxen (  NAPROSYN) 500 MG tablet   Other Relevant Orders   Ambulatory referral to Orthopedic Surgery      Meds ordered this encounter  Medications  . naproxen (NAPROSYN) 500 MG tablet    Sig: Take 1 tablet (500 mg total) by mouth 2 (two) times daily with a meal.    Dispense:  60 tablet    Refill:  0      Dr. Hayden [facility]  03/10/18

## 2018-03-23 ENCOUNTER — Other Ambulatory Visit: Payer: Self-pay | Admitting: Orthopedic Surgery

## 2018-03-23 DIAGNOSIS — Z6841 Body Mass Index (BMI) 40.0 and over, adult: Secondary | ICD-10-CM | POA: Diagnosis not present

## 2018-03-23 DIAGNOSIS — M2392 Unspecified internal derangement of left knee: Secondary | ICD-10-CM

## 2018-03-23 DIAGNOSIS — M1712 Unilateral primary osteoarthritis, left knee: Secondary | ICD-10-CM | POA: Diagnosis not present

## 2018-03-23 DIAGNOSIS — M2352 Chronic instability of knee, left knee: Secondary | ICD-10-CM | POA: Diagnosis not present

## 2018-04-04 ENCOUNTER — Ambulatory Visit
Admission: RE | Admit: 2018-04-04 | Discharge: 2018-04-04 | Disposition: A | Discharge status: Home / Self Care | Payer: Medicare HMO | Source: Ambulatory Visit | Attending: Orthopedic Surgery | Admitting: Orthopedic Surgery

## 2018-04-04 DIAGNOSIS — X58XXXA Exposure to other specified factors, initial encounter: Secondary | ICD-10-CM | POA: Diagnosis not present

## 2018-04-04 DIAGNOSIS — S83242A Other tear of medial meniscus, current injury, left knee, initial encounter: Secondary | ICD-10-CM | POA: Diagnosis not present

## 2018-04-04 DIAGNOSIS — M1712 Unilateral primary osteoarthritis, left knee: Secondary | ICD-10-CM | POA: Diagnosis present

## 2018-04-04 DIAGNOSIS — M948X8 Other specified disorders of cartilage, other site: Secondary | ICD-10-CM | POA: Diagnosis not present

## 2018-04-04 DIAGNOSIS — M2392 Unspecified internal derangement of left knee: Secondary | ICD-10-CM

## 2018-04-04 DIAGNOSIS — M2352 Chronic instability of knee, left knee: Secondary | ICD-10-CM | POA: Diagnosis present

## 2018-04-04 DIAGNOSIS — M25562 Pain in left knee: Secondary | ICD-10-CM | POA: Diagnosis not present

## 2018-04-19 ENCOUNTER — Other Ambulatory Visit: Payer: Self-pay | Admitting: Family Medicine

## 2018-04-19 DIAGNOSIS — M23304 Other meniscus derangements, unspecified medial meniscus, left knee: Secondary | ICD-10-CM

## 2018-04-26 DIAGNOSIS — M1712 Unilateral primary osteoarthritis, left knee: Secondary | ICD-10-CM | POA: Insufficient documentation

## 2018-04-26 DIAGNOSIS — Z6841 Body Mass Index (BMI) 40.0 and over, adult: Secondary | ICD-10-CM | POA: Diagnosis not present

## 2018-04-26 DIAGNOSIS — M23204 Derangement of unspecified medial meniscus due to old tear or injury, left knee: Secondary | ICD-10-CM | POA: Diagnosis not present

## 2018-05-31 ENCOUNTER — Ambulatory Visit (INDEPENDENT_AMBULATORY_CARE_PROVIDER_SITE_OTHER): Payer: Medicare HMO | Admitting: Family Medicine

## 2018-05-31 ENCOUNTER — Encounter: Payer: Self-pay | Admitting: Family Medicine

## 2018-05-31 VITALS — BP 128/70 | HR 72 | Ht 67.0 in | Wt 256.0 lb

## 2018-05-31 DIAGNOSIS — M659 Synovitis and tenosynovitis, unspecified: Secondary | ICD-10-CM | POA: Diagnosis not present

## 2018-05-31 DIAGNOSIS — L03116 Cellulitis of left lower limb: Secondary | ICD-10-CM | POA: Diagnosis not present

## 2018-05-31 MED ORDER — CEPHALEXIN 500 MG PO CAPS: 500.0000 mg | ORAL_CAPSULE | Freq: Four times a day (QID) | ORAL | 0 refills | Status: DC

## 2018-05-31 NOTE — Progress Notes (Signed)
Name: Miranda Jimenez   MRN: [id number]    DOB: 07/07/1951   Date:05/31/2018       Progress Note  Subjective  Chief Complaint  Chief Complaint  Patient presents with  . Foot Pain    L) foot pain- swollen and red on top of foot/ "feels like I'm walking on a ball" Has been taking ibuprofen, but switched over to Naproxen. [place]    Foot Injury   The incident occurred more than 1 week ago (for foot pain/ ). Incident location: [place] Injury mechanism: climbing stairs. The pain is present in the left foot. The quality of the pain is described as aching. The pain is at a severity of 6/10. The pain is moderate. The pain has been constant since onset. Pertinent negatives include no inability to bear weight or tingling. She has tried NSAIDs for the symptoms. The treatment provided no relief.    No problem-specific Assessment & Plan notes found for this encounter.   Past Medical History:  Diagnosis Date  . Allergy     Past Surgical History:  Procedure Laterality Date  . CATARACT EXTRACTION Bilateral   . ptk surgery      Family History  Problem Relation Age of Onset  . Cancer Mother        lung  . Cancer Father        colon    Social History   Socioeconomic History  . Marital status: Married    Spouse name: Not on file  . Number of children: Not on file  . Years of education: Not on file  . Highest education level: Not on file  Occupational History  . Not on file  Social Needs  . Financial resource strain: Not on file  . Food insecurity:    Worry: Not on file    Inability: Not on file  . Transportation needs:    Medical: Not on file    Non-medical: Not on file  Tobacco Use  . Smoking status: Never Smoker  . Smokeless tobacco: Never Used  Substance and Sexual Activity  . Alcohol use: No    Alcohol/week: 0.0 standard drinks  . Drug use: No  . Sexual activity: Never  Lifestyle  . Physical activity:     Days per week: Not on file    Minutes per session: Not on file  . Stress: Not on file  Relationships  . Social connections:    Talks on phone: Not on file    Gets together: Not on file    Attends religious service: Not on file    Active member of club or organization: Not on file    Attends meetings of clubs or organizations: Not on file    Relationship status: Not on file  . Intimate partner violence:    Fear of current or ex partner: Not on file    Emotionally abused: Not on file    Physically abused: Not on file    Forced sexual activity: Not on file  Other Topics Concern  . Not on file  Social History Narrative  . Not on file    No Known Allergies  Outpatient Medications Prior to Visit  Medication Sig Dispense Refill  . naproxen (NAPROSYN) 500 MG tablet TAKE 1 TABLET (500 MG TOTAL) BY MOUTH 2 (TWO) TIMES DAILY WITH A MEAL. 60 tablet 0   Facility-Administered Medications Prior to Visit  Medication Dose Route  Frequency Provider Last Rate Last Dose  . albuterol (PROVENTIL) (2.5 MG/3ML) 0.083% nebulizer solution 2.5 mg  2.5 mg Nebulization Once Jones, Deanna C, MD      . sulfacetamide (BLEPH-10) 10 % ophthalmic solution 1 drop  1 drop Both Eyes Q4H Jones, Deanna C, MD        Review of Systems  Constitutional: Negative for chills, fever, malaise/fatigue and weight loss.  HENT: Negative for ear discharge, ear pain and sore throat.   Eyes: Negative for blurred vision.  Respiratory: Negative for cough, sputum production, shortness of breath and wheezing.   Cardiovascular: Negative for chest pain, palpitations and leg swelling.  Gastrointestinal: Negative for abdominal pain, blood in stool, constipation, diarrhea, heartburn, melena and nausea.  Genitourinary: Negative for dysuria, frequency, hematuria and urgency.  Musculoskeletal: Negative for back pain, joint pain, myalgias and neck pain.  Skin: Negative for rash.  Neurological: Negative for dizziness, tingling, sensory  change, focal weakness and headaches.  Endo/Heme/Allergies: Negative for environmental allergies and polydipsia. Does not bruise/bleed easily.  Psychiatric/Behavioral: Negative for depression and suicidal ideas. The patient is not nervous/anxious and does not have insomnia.      Objective  Vitals:   05/31/18 1142  BP: 128/70  Pulse: 72  Weight: 256 lb (116.1 kg)  Height: 5\' 7"  (1.702 m)    Physical Exam  Constitutional: She is oriented to person, place, and time. She appears well-developed and well-nourished.  HENT:  Head: Normocephalic.  Right Ear: External ear normal.  Left Ear: External ear normal.  Mouth/Throat: Oropharynx is clear and moist.  Eyes: Pupils are equal, round, and reactive to light. Conjunctivae and EOM are normal. Lids are everted and swept, no foreign bodies found. Left eye exhibits no hordeolum. No foreign body present in the left eye. Right conjunctiva is not injected. Left conjunctiva is not injected. No scleral icterus.  Neck: Normal range of motion. Neck supple. No JVD present. No tracheal deviation present. No thyromegaly present.  Cardiovascular: Normal rate, regular rhythm, normal heart sounds and intact distal pulses. Exam reveals no gallop and no friction rub.  No murmur heard. Pulmonary/Chest: Effort normal and breath sounds normal. No respiratory distress. She has no wheezes. She has no rales.  Abdominal: Soft. Bowel sounds are normal. She exhibits no mass. There is no hepatosplenomegaly. There is no tenderness. There is no rebound and no guarding.  Musculoskeletal: Normal range of motion. She exhibits no edema.       Left ankle: She exhibits swelling. Tenderness. Medial malleolus tenderness found.  Erythema medial aspect left foot  Lymphadenopathy:    She has no cervical adenopathy.  Neurological: She is alert and oriented to person, place, and time. She has normal strength. She displays normal reflexes. No cranial nerve deficit.  Skin: Skin is  warm. No rash noted.  Psychiatric: She has a normal mood and affect. Her mood appears not anxious. She does not exhibit a depressed mood.  Nursing note and vitals reviewed.     Assessment & Plan  Problem List Items Addressed This Visit    None    Visit Diagnoses    Tenosynovitis of ankle    -  Primary   Acute pain with increased activity. Cont ibruprofen.    Cellulitis of left lower extremity       Acute/early cellulitis. Will initiate cephalexin 500mg  qid for 5 days.   Relevant Medications   cephALEXin (KEFLEX) 500 MG capsule      Meds ordered this encounter  Medications  .  cephALEXin (KEFLEX) 500 MG capsule    Sig: Take 1 capsule (500 mg total) by mouth 4 (four) times daily.    Dispense:  20 capsule    Refill:  0      Dr. Elizabeth Sauer [facility]  05/31/18

## 2018-05-31 NOTE — Patient Instructions (Signed)
Tenosynovitis  Tenosynovitis is inflammation of a tendon and the sleeve of tissue that covers the tendon (tendon sheath). A tendon is a cord of tissue that connects muscle to bone. Normally, a tendon slides smoothly inside its tendon sheath. Tenosynovitis limits movement of the tendon and surrounding tissues, which may cause pain and stiffness.  Tenosynovitis can affect any tendon and tendon sheath. Commonly affected areas include tendons in the:   Shoulder.   Arm.   Hand.   Hip.   Leg.   Foot.    What are the causes?  The main cause of this condition is wear and tear over time that results in slight tears in the tendon. Other possible causes include:   A sudden injury to the tendon or tendon sheath.   A disease that causes inflammation in the body.   An infection that spreads to the tendon and tendon sheath from a skin wound.   An infection in another part of the body that spreads to the tendon and tendon sheath through the blood.    What increases the risk?  The following factors may make you more likely to develop this condition:   Having rheumatoid arthritis, gout, or diabetes.   Using IV drugs.   Doing physical activities that can cause tendon overuse and stress.   Having gonorrhea.    What are the signs or symptoms?  Symptoms of this condition depend on the cause. Symptoms may include:   Pain with movement.   Pain and tenderness when pressing on the tendon and tendon sheath.   Swelling.   Stiffness.    If tenosynovitis is caused by an infection, symptoms may also include:   Fever.   Redness.   Warmth.    How is this diagnosed?  This condition may be diagnosed based on your medical history and a physical exam. You also may have:   Blood tests.   Imaging tests, such as:  ? MRI.  ? Ultrasound.   A sample of fluid removed from inside the tendon sheath to be checked in a lab.    How is this treated?  Treatment for this condition depends on the cause. If tenosynovitis is not caused by an  infection, treatment may include:   Resting the tendon.   Keeping the tendon in place for periods of time (immobilization) in a splint, brace, or sling.   NSAIDs to reduce pain and swelling.   A shot (injection) of medicine to help reduce pain and swelling (steroid).   Icing or applying heat to the affected area.   Physical therapy.   Surgery to release the tendon in the sheath or to repair damage to the tendon or tendon sheath. Surgery may be done if other treatments do not help relieve symptoms.    If tenosynovitis is caused by infection, treatment may include antibiotic medicine given through an IV. In some cases, surgery may be needed to drain fluid from the tendon sheath or to remove the tendon sheath.  Follow these instructions at home:  If you have a splint, brace, or sling:     Wear the splint, brace, or sling as told by your health care provider. Remove it only as told by your health care provider.   Loosen the splint, brace, or sling if your fingers or toes tingle, become numb, or turn cold and blue.   Do not let your splint or brace get wet if it is not waterproof.  ? Do not take baths, swim, or   use a hot tub until your health care provider approves. Ask your health care provider if you can take showers.  ? If your splint, brace, or sling is not waterproof, cover it with a watertight covering when you take a bath or a shower.   Keep the splint, brace, or sling clean.  Managing pain, stiffness, and swelling   If directed, apply ice to the affected area.  ? Put ice in a plastic bag.  ? Place a towel between your skin and the bag.  ? Leave the ice on for 20 minutes, 2-3 times a day.   Move the fingers or toes of the affected limb often, if this applies. This can help to prevent stiffness and lessen swelling.   If directed, raise (elevate) the affected area above the level of your heart while you are sitting or lying down.   If directed, apply heat to the affected area as often as told by your  health care provider. Use the heat source that your health care provider recommends, such as a moist heat pack or a heating pad.  ? Place a towel between your skin and the heat source.  ? Leave the heat on for 20-30 minutes.  ? Remove the heat if your skin turns bright red. This is especially important if you are unable to feel pain, heat, or cold. You may have a greater risk of getting burned.  Driving   Do not drive or operate heavy machinery while taking prescription pain medicine.   Ask your health care provider when it is safe to drive if you have a splint or brace on any part of your arm or leg.  Activity   Return to your normal activities as told by your health care provider. Ask your health care provider what activities are safe for you.   Rest the affected area as told by your health care provider.   Avoid using the affected area while you are having symptoms of tenosynovitis.   If physical therapy was prescribed, do exercises as told by your health care provider.  Safety   Do not use the injured limb to support your body weight until your health care provider says that you can.  General instructions   Take over-the-counter and prescription medicines only as told by your health care provider.   Keep all follow-up visits as told by your health care provider. This is important.  Contact a health care provider if:   Your symptoms are not improving or are getting worse.   You have a fever and more of any of the following symptoms:  ? Pain.  ? Redness.  ? Warmth.  ? Swelling.  Get help right away if:   Your fingers or toes become numb or turn blue.  This information is not intended to replace advice given to you by your health care provider. Make sure you discuss any questions you have with your health care provider.  Document Released: 09/27/2005 Document Revised: 05/27/2016 Document Reviewed: 08/06/2015  Elsevier Interactive Patient Education  2018 [employer]

## 2018-08-10 DIAGNOSIS — R69 Illness, unspecified: Secondary | ICD-10-CM | POA: Diagnosis not present

## 2018-12-20 ENCOUNTER — Ambulatory Visit: Payer: Medicare HMO

## 2019-01-09 ENCOUNTER — Ambulatory Visit: Payer: Medicare HMO | Admitting: Family Medicine

## 2019-05-07 ENCOUNTER — Ambulatory Visit (INDEPENDENT_AMBULATORY_CARE_PROVIDER_SITE_OTHER): Payer: Medicare HMO

## 2019-05-07 VITALS — BP 126/55 | HR 76 | Temp 97.2°F | Ht 67.0 in | Wt 257.0 lb

## 2019-05-07 DIAGNOSIS — Z0000 Encounter for general adult medical examination without abnormal findings: Secondary | ICD-10-CM

## 2019-05-07 DIAGNOSIS — Z1231 Encounter for screening mammogram for malignant neoplasm of breast: Secondary | ICD-10-CM | POA: Diagnosis not present

## 2019-05-07 DIAGNOSIS — Z780 Asymptomatic menopausal state: Secondary | ICD-10-CM

## 2019-05-07 NOTE — Progress Notes (Signed)
Subjective:   Artist PaisVerna D Joyce is a 68 y.o. female who presents for an Initial Medicare Annual Wellness Visit.  Virtual Visit via Telephone Note  I connected with Hubbard HartshornVerna D Welden on 05/07/19 at  8:00 AM EDT by telephone and verified that I am speaking with the correct person using two identifiers.  Medicare Annual Wellness visit completed telephonically due to Covid-19 pandemic.   Location: Patient: home Provider: office   I discussed the limitations, risks, security and privacy concerns of performing an evaluation and management service by telephone and the availability of in person appointments. The patient expressed understanding and agreed to proceed.  Some vital signs may be absent or patient reported.  Reather LittlerKasey Elga Santy, LPN  Review of Systems     Cardiac Risk Factors include: advanced age (>6655men, 70>65 women);obesity (BMI >30kg/m2)     Objective:    Today's Vitals   05/07/19 0813  BP: (!) 126/55  Pulse: 76  Temp: (!) 97.2 F (36.2 C)  Weight: 257 lb (116.6 kg)  Height: 5\' 7"  (1.702 m)   Body mass index is 40.25 kg/m.  Advanced Directives 05/07/2019 02/18/2018  Does Patient Have a Medical Advance Directive? No No  Would patient like information on creating a medical advance directive? No - Patient declined -    Current Medications (verified) Outpatient Encounter Medications as of 05/07/2019  Medication Sig  . B Complex Vitamins (VITAMIN B COMPLEX PO) Take 1 tablet by mouth daily.  Marland Kitchen. loratadine (CLARITIN) 10 MG tablet Take 10 mg by mouth daily.  . [DISCONTINUED] cephALEXin (KEFLEX) 500 MG capsule Take 1 capsule (500 mg total) by mouth 4 (four) times daily.  . [DISCONTINUED] naproxen (NAPROSYN) 500 MG tablet TAKE 1 TABLET (500 MG TOTAL) BY MOUTH 2 (TWO) TIMES DAILY WITH A MEAL.   Facility-Administered Encounter Medications as of 05/07/2019  Medication  . albuterol (PROVENTIL) (2.5 MG/3ML) 0.083% nebulizer solution 2.5 mg  . sulfacetamide (BLEPH-10) 10 %  ophthalmic solution 1 drop    Allergies (verified) Patient has no known allergies.   History: Past Medical History:  Diagnosis Date  . Allergy    Past Surgical History:  Procedure Laterality Date  . CATARACT EXTRACTION Bilateral   . ptk surgery     Family History  Problem Relation Age of Onset  . Cancer Mother        lung  . Cancer Father        colon   Social History   Socioeconomic History  . Marital status: Married    Spouse name: Not on file  . Number of children: 2  . Years of education: Not on file  . Highest education level: Some college, no degree  Occupational History  . Occupation: retired  Engineer, productionocial Needs  . Financial resource strain: Not hard at all  . Food insecurity    Worry: Never true    Inability: Never true  . Transportation needs    Medical: No    Non-medical: No  Tobacco Use  . Smoking status: Never Smoker  . Smokeless tobacco: Never Used  Substance and Sexual Activity  . Alcohol use: No    Alcohol/week: 0.0 standard drinks  . Drug use: No  . Sexual activity: Not on file  Lifestyle  . Physical activity    Days per week: 0 days    Minutes per session: 0 min  . Stress: Not at all  Relationships  . Social connections    Talks on phone: More than three times a week  Gets together: Three times a week    Attends religious service: Never    Active member of club or organization: No    Attends meetings of clubs or organizations: Never    Relationship status: Married  Other Topics Concern  . Not on file  Social History Narrative   [place], [place]    Tobacco Counseling Counseling given: Not Answered   Clinical Intake:  Pre-visit preparation completed: Yes  Pain : No/denies pain     BMI - recorded: 40.25 Nutritional Status: BMI > 30  Obese Nutritional Risks: None Diabetes: No  How often do you need to have someone help you when you read instructions, pamphlets, or other written  materials from your doctor or pharmacy?: 1 - Never  Interpreter Needed?: No  Information entered by :: Reather LittlerKasey Camdan Burdi LPN   Activities of Daily Living In your present state of health, do you have any difficulty performing the following activities: 05/07/2019  Hearing? N  Comment declines hearing aids  Vision? N  Difficulty concentrating or making decisions? N  Walking or climbing stairs? N  Dressing or bathing? N  Doing errands, shopping? N  Preparing Food and eating ? N  Using the Toilet? N  In the past six months, have you accidently leaked urine? N  Do you have problems with loss of bowel control? N  Managing your Medications? N  Managing your Finances? N  Housekeeping or managing your Housekeeping? N  Some recent data might be hidden     Immunizations and Health Maintenance  There is no immunization history on file for this patient. Health Maintenance Due  Topic Date Due  . Hepatitis C Screening  Jul 04, 1951  . TETANUS/TDAP  11/12/1971  . MAMMOGRAM  11/11/2002  . COLONOSCOPY  11/11/2002  . DEXA SCAN  11/11/2017  . PNA vac Low Risk Adult (1 of 2 - PCV13) 11/11/2017    Patient Care Team: Duanne LimerickJones, Deanna C, MD as PCP - General (Family Medicine)  Indicate any recent Medical Services you may have received from other than [facility] providers in the past year (date may be approximate).     Assessment:   This is a routine wellness examination for Scheryl MartenVerna.  Hearing/Vision screen  Hearing Screening   125Hz  250Hz  500Hz  1000Hz  2000Hz  3000Hz  4000Hz  6000Hz  8000Hz   Right ear:           Left ear:           Comments: Pt denies hearing difficulty  Vision Screening Comments: [facility]  Dietary issues and exercise activities discussed: Current Exercise Habits: The patient does not participate in regular exercise at present, Exercise limited by: None identified  Goals   None    Depression Screen PHQ 2/9 Scores 05/07/2019  05/31/2018 03/10/2018 03/10/2018 08/14/2015 06/24/2015  PHQ - 2 Score 0 0 0 0 0 0  PHQ- 9 Score - - 0 - - -    Fall Risk Fall Risk  05/07/2019 05/31/2018 08/14/2015 06/24/2015  Falls in the past year? 0 No No No  Number falls in past yr: 0 - - -  Injury with Fall? 0 - - -  Follow up Falls prevention discussed - - -   FALL RISK PREVENTION PERTAINING TO THE HOME:  Any stairs in or around the home? Yes  If so, do they handrails? Yes   Home free of loose throw rugs in walkways, pet beds, electrical cords, etc?  Yes  Adequate lighting in your home to reduce risk of falls? Yes   ASSISTIVE DEVICES UTILIZED TO PREVENT FALLS:  Life alert? No  Use of a cane, walker or w/c? No  Grab bars in the bathroom? No  Shower chair or bench in shower? No  Elevated toilet seat or a handicapped toilet? No   DME ORDERS:  DME order needed?  No   TIMED UP AND GO:  Was the test performed? No . Telephonic visit.   Education: Fall risk prevention has been discussed.  Intervention(s) required? No   Cognitive Function:     6CIT Screen 05/07/2019  What Year? 0 points  What month? 0 points  What time? 0 points  Count back from 20 0 points  Months in reverse 0 points  Repeat phrase 0 points  Total Score 0    Screening Tests Health Maintenance  Topic Date Due  . Hepatitis C Screening  05/28/1951  . TETANUS/TDAP  11/12/1971  . MAMMOGRAM  11/11/2002  . COLONOSCOPY  11/11/2002  . DEXA SCAN  11/11/2017  . PNA vac Low Risk Adult (1 of 2 - PCV13) 11/11/2017  . INFLUENZA VACCINE  05/12/2019    Qualifies for Shingles Vaccine? Yes . Due for Shingrix. Education has been provided regarding the importance of this vaccine. Pt has been advised to call insurance company to determine out of pocket expense. Advised may also receive vaccine at local pharmacy or Health Dept. Verbalized acceptance and understanding.  Tdap: Although this vaccine is not a covered service during a Wellness Exam, does the patient still  wish to receive this vaccine today?  No .  Education has been provided regarding the importance of this vaccine. Advised may receive this vaccine at local pharmacy or Health Dept. Aware to provide a copy of the vaccination record if obtained from local pharmacy or Health Dept. Verbalized acceptance and understanding.  Flu Vaccine: Due for Flu vaccine. Does the patient want to receive this vaccine today?  No . Education has been provided regarding the importance of this vaccine but still declined. Advised may receive this vaccine at local pharmacy or Health Dept. Aware to provide a copy of the vaccination record if obtained from local pharmacy or Health Dept. Verbalized acceptance and understanding.  Pneumococcal Vaccine: Due for Pneumococcal vaccine. Does the patient want to receive this vaccine today?  No . Education has been provided regarding the importance of this vaccine but still declined. Advised may receive this vaccine at local pharmacy or Health Dept. Aware to provide a copy of the vaccination record if obtained from local pharmacy or Health Dept. Verbalized acceptance and understanding.   Cancer Screenings:  Colorectal Screening: Not completed. Postponed.   Mammogram: Not completed. Repeat every year; Ordered today. Pt provided with contact information and advised to call to schedule appt.   Bone Density: Not completed. Ordered today. Pt provided with contact information and advised to call to schedule appt.   Lung Cancer Screening: (Low Dose CT Chest recommended if Age 24-80 years, 30 pack-year currently smoking OR have quit w/in 15years.) does not qualify.    Additional Screening:  Hepatitis C Screening: does qualify; postponed  Vision Screening: Recommended annual ophthalmology exams for early detection of glaucoma and other disorders of the eye. Is the patient up to date with their annual eye exam?  Yes  Who is the provider or what is the name of the office in which the pt  attends annual eye exams? Dr. Dewaine CongerBarker  Dental Screening:  Recommended annual dental exams for proper oral hygiene  Community Resource Referral:  CRR required this visit?  No      Plan:    I have personally reviewed and addressed the Medicare Annual Wellness questionnaire and have noted the following in the patient's chart:  A. Medical and social history B. Use of alcohol, tobacco or illicit drugs  C. Current medications and supplements D. Functional ability and status E.  Nutritional status F.  Physical activity G. Advance directives H. List of other physicians I.  Hospitalizations, surgeries, and ER visits in previous 12 months J.  [place] such as hearing and vision if needed, cognitive and depression L. Referrals and appointments   In addition, I have reviewed and discussed with patient certain preventive protocols, quality metrics, and best practice recommendations. A written personalized care plan for preventive services as well as general preventive health recommendations were provided to patient.   Signed,  Clemetine Marker, LPN Nurse Health Advisor   Nurse Notes: pt doing well and appreciative of visit today.

## 2019-05-07 NOTE — Patient Instructions (Signed)
[name] , Thank you for taking time to come for your Medicare Wellness Visit. I appreciate your ongoing commitment to your health goals. Please review the following plan we discussed and let me know if I can assist you in the future.   Screening recommendations/referrals: Colonoscopy: postponed Mammogram: Please call [phone] to schedule your mammogram and bone density screening.  Bone Density: ordered today.  Recommended yearly ophthalmology/optometry visit for glaucoma screening and checkup Recommended yearly dental visit for hygiene and checkup  Vaccinations: Influenza vaccine: postponed Pneumococcal vaccine: postponed Tdap vaccine: postponed Shingles vaccine: Shingrix discussed. Please contact your pharmacy for coverage information.   Advanced directives: Advance directive discussed with you today. Even though you declined this today please call our office should you change your mind and we can give you the proper paperwork for you to fill out.  Conditions/risks identified: Recommend increasing physical activity to at least 3 days per week.   Next appointment: Please follow up in one year for your Medicare Annual Wellness visit.     Preventive Care 66 Years and Older, Female Preventive care refers to lifestyle choices and visits with your health care provider that can promote health and wellness. What does preventive care include?  A yearly physical exam. This is also called an annual well check.  Dental exams once or twice a year.  Routine eye exams. Ask your health care provider how often you should have your eyes checked.  Personal lifestyle choices, including:  Daily care of your teeth and gums.  Regular physical activity.  Eating a healthy diet.  Avoiding tobacco and drug use.  Limiting alcohol use.  Practicing safe sex.  Taking low-dose aspirin every day.  Taking vitamin and mineral supplements as recommended by your health care provider. What  happens during an annual well check? The services and screenings done by your health care provider during your annual well check will depend on your age, overall health, lifestyle risk factors, and family history of disease. Counseling  Your health care provider may ask you questions about your:  Alcohol use.  Tobacco use.  Drug use.  Emotional well-being.  Home and relationship well-being.  Sexual activity.  Eating habits.  History of falls.  Memory and ability to understand (cognition).  Work and work Statistician.  Reproductive health. Screening  You may have the following tests or measurements:  Height, weight, and BMI.  Blood pressure.  Lipid and cholesterol levels. These may be checked every 5 years, or more frequently if you are over 52 years old.  Skin check.  Lung cancer screening. You may have this screening every year starting at age 49 if you have a 30-pack-year history of smoking and currently smoke or have quit within the past 15 years.  Fecal occult blood test (FOBT) of the stool. You may have this test every year starting at age 65.  Flexible sigmoidoscopy or colonoscopy. You may have a sigmoidoscopy every 5 years or a colonoscopy every 10 years starting at age 72.  Hepatitis C blood test.  Hepatitis B blood test.  Sexually transmitted disease (STD) testing.  Diabetes screening. This is done by checking your blood sugar (glucose) after you have not eaten for a while (fasting). You may have this done every 1-3 years.  Bone density scan. This is done to screen for osteoporosis. You may have this done starting at age 68.  Mammogram. This may be done every 1-2 years. Talk to your health care provider about how often you should have  regular mammograms. Talk with your health care provider about your test results, treatment options, and if necessary, the need for more tests. Vaccines  Your health care provider may recommend certain vaccines, such  as:  Influenza vaccine. This is recommended every year.  Tetanus, diphtheria, and acellular pertussis (Tdap, Td) vaccine. You may need a Td booster every 10 years.  Zoster vaccine. You may need this after age 55.  Pneumococcal 13-valent conjugate (PCV13) vaccine. One dose is recommended after age 50.  Pneumococcal polysaccharide (PPSV23) vaccine. One dose is recommended after age 68. Talk to your health care provider about which screenings and vaccines you need and how often you need them. This information is not intended to replace advice given to you by your health care provider. Make sure you discuss any questions you have with your health care provider. Document Released: 10/24/2015 Document Revised: 06/16/2016 Document Reviewed: 07/29/2015 Elsevier Interactive Patient Education  2017 [employer] Prevention in the Home Falls can cause injuries. They can happen to people of all ages. There are many things you can do to make your home safe and to help prevent falls. What can I do on the outside of my home?  Regularly fix the edges of walkways and driveways and fix any cracks.  Remove anything that might make you trip as you walk through a door, such as a raised step or threshold.  Trim any bushes or trees on the path to your home.  Use bright outdoor lighting.  Clear any walking paths of anything that might make someone trip, such as rocks or tools.  Regularly check to see if handrails are loose or broken. Make sure that both sides of any steps have handrails.  Any raised decks and porches should have guardrails on the edges.  Have any leaves, snow, or ice cleared regularly.  Use sand or salt on walking paths during winter.  Clean up any spills in your garage right away. This includes oil or grease spills. What can I do in the bathroom?  Use night lights.  Install grab bars by the toilet and in the tub and shower. Do not use towel bars as grab bars.  Use  non-skid mats or decals in the tub or shower.  If you need to sit down in the shower, use a plastic, non-slip stool.  Keep the floor dry. Clean up any water that spills on the floor as soon as it happens.  Remove soap buildup in the tub or shower regularly.  Attach bath mats securely with double-sided non-slip rug tape.  Do not have throw rugs and other things on the floor that can make you trip. What can I do in the bedroom?  Use night lights.  Make sure that you have a light by your bed that is easy to reach.  Do not use any sheets or blankets that are too big for your bed. They should not hang down onto the floor.  Have a firm chair that has side arms. You can use this for support while you get dressed.  Do not have throw rugs and other things on the floor that can make you trip. What can I do in the kitchen?  Clean up any spills right away.  Avoid walking on wet floors.  Keep items that you use a lot in easy-to-reach places.  If you need to reach something above you, use a strong step stool that has a grab bar.  Keep electrical cords out of the  way.  Do not use floor polish or wax that makes floors slippery. If you must use wax, use non-skid floor wax.  Do not have throw rugs and other things on the floor that can make you trip. What can I do with my stairs?  Do not leave any items on the stairs.  Make sure that there are handrails on both sides of the stairs and use them. Fix handrails that are broken or loose. Make sure that handrails are as long as the stairways.  Check any carpeting to make sure that it is firmly attached to the stairs. Fix any carpet that is loose or worn.  Avoid having throw rugs at the top or bottom of the stairs. If you do have throw rugs, attach them to the floor with carpet tape.  Make sure that you have a light switch at the top of the stairs and the bottom of the stairs. If you do not have them, ask someone to add them for you. What  else can I do to help prevent falls?  Wear shoes that:  Do not have high heels.  Have rubber bottoms.  Are comfortable and fit you well.  Are closed at the toe. Do not wear sandals.  If you use a stepladder:  Make sure that it is fully opened. Do not climb a closed stepladder.  Make sure that both sides of the stepladder are locked into place.  Ask someone to hold it for you, if possible.  Clearly mark and make sure that you can see:  Any grab bars or handrails.  First and last steps.  Where the edge of each step is.  Use tools that help you move around (mobility aids) if they are needed. These include:  Canes.  Walkers.  Scooters.  Crutches.  Turn on the lights when you go into a dark area. Replace any light bulbs as soon as they burn out.  Set up your furniture so you have a clear path. Avoid moving your furniture around.  If any of your floors are uneven, fix them.  If there are any pets around you, be aware of where they are.  Review your medicines with your doctor. Some medicines can make you feel dizzy. This can increase your chance of falling. Ask your doctor what other things that you can do to help prevent falls. This information is not intended to replace advice given to you by your health care provider. Make sure you discuss any questions you have with your health care provider. Document Released: 07/24/2009 Document Revised: 03/04/2016 Document Reviewed: 11/01/2014 Elsevier Interactive Patient Education  2017 [employer].

## 2019-05-09 ENCOUNTER — Other Ambulatory Visit: Payer: Self-pay

## 2019-05-09 ENCOUNTER — Encounter: Payer: Self-pay | Admitting: Family Medicine

## 2019-05-09 ENCOUNTER — Ambulatory Visit (INDEPENDENT_AMBULATORY_CARE_PROVIDER_SITE_OTHER): Payer: Medicare HMO | Admitting: Family Medicine

## 2019-05-09 VITALS — BP 128/62 | HR 64 | Ht 67.0 in | Wt 259.0 lb

## 2019-05-09 DIAGNOSIS — R195 Other fecal abnormalities: Secondary | ICD-10-CM

## 2019-05-09 DIAGNOSIS — Z23 Encounter for immunization: Secondary | ICD-10-CM

## 2019-05-09 DIAGNOSIS — Z0000 Encounter for general adult medical examination without abnormal findings: Secondary | ICD-10-CM

## 2019-05-09 LAB — HEMOCCULT GUIAC POC 1CARD (OFFICE): Fecal Occult Blood, POC: POSITIVE — AB

## 2019-05-09 NOTE — Progress Notes (Signed)
Date:  05/09/2019   Name:  Miranda Jimenez   DOB:  [birth year]   MRN:  [id number]   Chief Complaint: Annual Exam (breast exam/ no pap) and pneum 13  Patient is a 68 year old female who presents for a comprehensive physical exam. The patient reports the following problems: none. Health maintenance has been reviewed up to date.   Review of Systems  Constitutional: Negative.  Negative for chills, fatigue, fever and unexpected weight change.  HENT: Negative for congestion, ear discharge, ear pain, rhinorrhea, sinus pressure, sneezing and sore throat.   Eyes: Negative for photophobia, pain, discharge, redness and itching.  Respiratory: Negative for cough, shortness of breath, wheezing and stridor.   Cardiovascular: Negative for chest pain, palpitations and leg swelling.  Gastrointestinal: Positive for blood in stool. Negative for abdominal pain, constipation, diarrhea, nausea and vomiting.  Endocrine: Negative for cold intolerance, heat intolerance, polydipsia, polyphagia and polyuria.  Genitourinary: Negative for dysuria, flank pain, frequency, hematuria, menstrual problem, pelvic pain, urgency, vaginal bleeding and vaginal discharge.  Musculoskeletal: Negative for arthralgias, back pain and myalgias.  Skin: Negative for rash.  Allergic/Immunologic: Negative for environmental allergies and food allergies.  Neurological: Negative for dizziness, weakness, light-headedness, numbness and headaches.  Hematological: Negative for adenopathy. Does not bruise/bleed easily.  Psychiatric/Behavioral: Negative for dysphoric mood. The patient is not nervous/anxious.     Patient Active Problem List   Diagnosis Date Noted  . Primary osteoarthritis of left knee 04/26/2018  . Morbid obesity with BMI of 40.0-44.9, adult ([place]) 03/23/2018    No Known Allergies  Past Surgical History:  Procedure Laterality Date  . CATARACT EXTRACTION Bilateral   . ptk surgery      Social History   Tobacco Use   . Smoking status: Never Smoker  . Smokeless tobacco: Never Used  Substance Use Topics  . Alcohol use: No    Alcohol/week: 0.0 standard drinks  . Drug use: No     Medication list has been reviewed and updated.  Current Meds  Medication Sig  . B Complex Vitamins (VITAMIN B COMPLEX PO) Take 1 tablet by mouth daily.  Marland Kitchen loratadine (CLARITIN) 10 MG tablet Take 10 mg by mouth daily.   Current Facility-Administered Medications for the 05/09/19 encounter (Office Visit) with Juline Patch, MD  Medication  . albuterol (PROVENTIL) (2.5 MG/3ML) 0.083% nebulizer solution 2.5 mg    PHQ 2/9 Scores 05/07/2019 05/31/2018 03/10/2018 03/10/2018  PHQ - 2 Score 0 0 0 0  PHQ- 9 Score - - 0 -    BP Readings from Last 3 Encounters:  05/09/19 128/62  05/07/19 (!) 126/55  05/31/18 128/70    Physical Exam Vitals signs and nursing note reviewed.  Constitutional:      Appearance: She is well-developed. She is obese.  HENT:     Head: Normocephalic.     Jaw: There is normal jaw occlusion.     Right Ear: Hearing, tympanic membrane, ear canal and external ear normal.     Left Ear: Hearing, tympanic membrane, ear canal and external ear normal.     Nose: Nose normal.     Mouth/Throat:     Lips: Pink.     Mouth: Mucous membranes are moist.     Dentition: Normal dentition.     Tongue: No lesions. Tongue does not deviate from midline.  Eyes:     General: Lids are normal. Vision grossly intact. Gaze aligned appropriately. No scleral icterus.       Left eye:  No foreign body or hordeolum.     Conjunctiva/sclera: Conjunctivae normal.     Right eye: Right conjunctiva is not injected.     Left eye: Left conjunctiva is not injected.     Pupils: Pupils are equal, round, and reactive to light.     Funduscopic exam:    Right eye: No AV nicking.        Left eye: No AV nicking.  Neck:     Musculoskeletal: Full passive range of motion without pain, normal range of motion and neck supple.     Thyroid: No thyroid  mass, thyromegaly or thyroid tenderness.     Vascular: No JVD.     Trachea: No tracheal deviation.  Cardiovascular:     Rate and Rhythm: Normal rate and regular rhythm.     Chest Wall: PMI is not displaced. No thrill.     Pulses: Normal pulses.          Carotid pulses are 2+ on the right side and 2+ on the left side.      Radial pulses are 2+ on the right side and 2+ on the left side.       Femoral pulses are 2+ on the right side and 2+ on the left side.      Popliteal pulses are 2+ on the right side and 2+ on the left side.       Dorsalis pedis pulses are 2+ on the right side and 2+ on the left side.       Posterior tibial pulses are 2+ on the right side and 2+ on the left side.     Heart sounds: Normal heart sounds, S1 normal and S2 normal. No murmur. No systolic murmur. No diastolic murmur. No friction rub. No gallop. No S3 or S4 sounds.   Pulmonary:     Effort: Pulmonary effort is normal. No respiratory distress.     Breath sounds: Normal breath sounds. No decreased air movement or transmitted upper airway sounds. No decreased breath sounds, wheezing, rhonchi or rales.  Chest:     Chest wall: No mass.     Breasts: Breasts are symmetrical.        Right: Normal. No swelling, bleeding, inverted nipple, mass, nipple discharge, skin change or tenderness.        Left: Normal. No swelling, bleeding, inverted nipple, mass, nipple discharge, skin change or tenderness.  Abdominal:     General: Bowel sounds are normal.     Palpations: Abdomen is soft. There is no hepatomegaly, splenomegaly or mass.     Tenderness: There is no abdominal tenderness. There is no right CVA tenderness, left CVA tenderness, guarding or rebound.     Hernia: No hernia is present. There is no hernia in the umbilical area or ventral area.  Genitourinary:    Exam position: Knee-chest position.     Rectum: Guaiac result positive. No mass, tenderness or external hemorrhoid.  Musculoskeletal: Normal range of motion.         General: No tenderness.     Right lower leg: No edema.     Left lower leg: No edema.  Lymphadenopathy:     Head:     Right side of head: No submental, submandibular or tonsillar adenopathy.     Left side of head: No submental, submandibular or tonsillar adenopathy.     Cervical: No cervical adenopathy.     Right cervical: No superficial, deep or posterior cervical adenopathy.    Left cervical: No superficial,  deep or posterior cervical adenopathy.     Upper Body:     Right upper body: No supraclavicular or axillary adenopathy.     Left upper body: No supraclavicular or axillary adenopathy.  Skin:    General: Skin is warm.     Capillary Refill: Capillary refill takes less than 2 seconds.     Findings: No rash.  Neurological:     General: No focal deficit present.     Mental Status: She is alert, oriented to person, place, and time and easily aroused.     Cranial Nerves: Cranial nerves are intact. No cranial nerve deficit.     Sensory: Sensation is intact.     Motor: Motor function is intact.     Deep Tendon Reflexes: Reflexes normal.  Psychiatric:        Mood and Affect: Mood is not anxious or depressed.        Behavior: Behavior is cooperative.     Wt Readings from Last 3 Encounters:  05/09/19 259 lb (117.5 kg)  05/07/19 257 lb (116.6 kg)  05/31/18 256 lb (116.1 kg)    BP 128/62   Pulse 64   Ht 5\' 7"  (1.702 m)   Wt 259 lb (117.5 kg)   BMI 40.57 kg/m   Assessment and Plan:  1. Annual physical exam No subjective/objective concerns noted on history and physical exam.  Previous encounters and labs were reviewed.Hubbard HartshornVerna D Poznanski is a 68 y.o. female who presents today for her Complete Annual Exam. She feels well. She reports exercising . She reports she is sleeping well.  Immunizations are reviewed and recommendations provided.   Age appropriate screening tests are discussed. Counseling given for risk factor reduction interventions.Health risks of being over weight were  discussed and patient was counseled on weight loss options and exercise.  Patient was given Mediterranean diet. - Renal function panel - Lipid Panel With LDL/HDL Ratio  2. Stool guaiac positive Patient was very reluctant about having a colonoscopy.  After discussion and noting that the patient's occult guaiac was noted to be positive patient agreed to allowing us to schedule for colonoscopy. - POCT occult blood stool - Ambulatory referral to Gastroenterology  3. Need for pneumococcal vaccination Discussed and administered. - Pneumococcal conjugate vaccine 13-valent

## 2019-05-10 ENCOUNTER — Telehealth: Payer: Self-pay

## 2019-05-10 LAB — RENAL FUNCTION PANEL
Albumin: 4.3 g/dL (ref 3.8–4.8)
BUN/Creatinine Ratio: 15 (ref 12–28)
BUN: 14 mg/dL (ref 8–27)
CO2: 24 mmol/L (ref 20–29)
Calcium: 9.3 mg/dL (ref 8.7–10.3)
Chloride: 102 mmol/L (ref 96–106)
Creatinine, Ser: 0.95 mg/dL (ref 0.57–1.00)
GFR calc Af Amer: 72 mL/min/{1.73_m2} (ref >59)
GFR calc non Af Amer: 63 mL/min/{1.73_m2} (ref >59)
Glucose: 104 mg/dL — ABNORMAL HIGH (ref 65–99)
Phosphorus: 3.3 mg/dL (ref 3.0–4.3)
Potassium: 4.6 mmol/L (ref 3.5–5.2)
Sodium: 140 mmol/L (ref 134–144)

## 2019-05-10 LAB — LIPID PANEL WITH LDL/HDL RATIO
Cholesterol, Total: 162 mg/dL (ref 100–199)
HDL: 53 mg/dL (ref >39)
LDL Calculated: 97 mg/dL (ref 0–99)
LDl/HDL Ratio: 1.8 ratio (ref 0.0–3.2)
Triglycerides: 59 mg/dL (ref 0–149)
VLDL Cholesterol Cal: 12 mg/dL (ref 5–40)

## 2019-05-10 NOTE — Telephone Encounter (Signed)
Pt called in stating that she called her insurance and Dr Dorothey Baseman covered under her plan. However, she was told that she "has a 375.00 copay for a colonoscopy." Therefore, she is "not going to do it, not having any problems and I will call if I start having them." She also stated that if she "has a copay for my mammogram or bone density, I'll probably back out of them also."

## 2019-05-31 ENCOUNTER — Ambulatory Visit
Admission: RE | Admit: 2019-05-31 | Discharge: 2019-05-31 | Disposition: A | Discharge status: Home / Self Care | Payer: Medicare HMO | Source: Ambulatory Visit | Attending: Family Medicine | Admitting: Family Medicine

## 2019-05-31 ENCOUNTER — Other Ambulatory Visit: Payer: Self-pay

## 2019-05-31 DIAGNOSIS — Z1231 Encounter for screening mammogram for malignant neoplasm of breast: Secondary | ICD-10-CM | POA: Diagnosis not present

## 2019-05-31 DIAGNOSIS — Z13820 Encounter for screening for osteoporosis: Secondary | ICD-10-CM | POA: Diagnosis not present

## 2019-05-31 DIAGNOSIS — Z780 Asymptomatic menopausal state: Secondary | ICD-10-CM | POA: Diagnosis not present

## 2019-07-19 DIAGNOSIS — R69 Illness, unspecified: Secondary | ICD-10-CM | POA: Diagnosis not present

## 2019-08-23 DIAGNOSIS — R69 Illness, unspecified: Secondary | ICD-10-CM | POA: Diagnosis not present

## 2020-01-22 DIAGNOSIS — R69 Illness, unspecified: Secondary | ICD-10-CM | POA: Diagnosis not present

## 2020-05-07 ENCOUNTER — Ambulatory Visit: Payer: Medicare HMO

## 2020-06-06 ENCOUNTER — Other Ambulatory Visit: Payer: Self-pay

## 2020-06-06 ENCOUNTER — Ambulatory Visit (INDEPENDENT_AMBULATORY_CARE_PROVIDER_SITE_OTHER): Payer: Medicare HMO

## 2020-06-06 ENCOUNTER — Encounter: Payer: Self-pay | Admitting: Family Medicine

## 2020-06-06 DIAGNOSIS — Z23 Encounter for immunization: Secondary | ICD-10-CM | POA: Diagnosis not present

## 2020-07-30 DIAGNOSIS — R69 Illness, unspecified: Secondary | ICD-10-CM | POA: Diagnosis not present

## 2020-09-29 ENCOUNTER — Telehealth: Payer: Self-pay

## 2020-09-29 NOTE — Telephone Encounter (Signed)
Pt did not want to set up appt, has no meds to be refilled and was not interested in setting up cpe

## 2020-09-29 NOTE — Telephone Encounter (Signed)
This is the pt that needs to be seen at the first of the year if she wants to remain established with Yetta Barre

## 2020-09-29 NOTE — Telephone Encounter (Signed)
Working on end of year metrics. Patient has not been seen since July of 2020. Patient needs to schedule an appointment with Dr. Yetta Barre. Please call patient.  Cinde Ebert

## 2020-10-09 LAB — FECAL OCCULT BLOOD, IMMUNOCHEMICAL: IFOBT: NEGATIVE

## 2020-10-23 ENCOUNTER — Encounter: Payer: Self-pay | Admitting: Family Medicine

## 2020-10-23 ENCOUNTER — Other Ambulatory Visit: Payer: Self-pay

## 2020-10-23 ENCOUNTER — Ambulatory Visit (INDEPENDENT_AMBULATORY_CARE_PROVIDER_SITE_OTHER): Payer: Medicare HMO | Admitting: Family Medicine

## 2020-10-23 VITALS — BP 138/80 | HR 76 | Ht 67.0 in | Wt 254.0 lb

## 2020-10-23 DIAGNOSIS — R030 Elevated blood-pressure reading, without diagnosis of hypertension: Secondary | ICD-10-CM

## 2020-10-23 DIAGNOSIS — E785 Hyperlipidemia, unspecified: Secondary | ICD-10-CM | POA: Diagnosis not present

## 2020-10-23 DIAGNOSIS — Z6839 Body mass index (BMI) 39.0-39.9, adult: Secondary | ICD-10-CM

## 2020-10-23 DIAGNOSIS — Z0000 Encounter for general adult medical examination without abnormal findings: Secondary | ICD-10-CM | POA: Diagnosis not present

## 2020-10-23 NOTE — Progress Notes (Signed)
Date:  10/23/2020   Name:  Miranda Jimenez   DOB:  October 17, 1950   MRN:  [id number]   Chief Complaint: wellness exam (No issues/ needs lipid and renal)  Patient is a 70 year old female who presents for a comprehensive physical exam. The patient reports the following problems: none. Health maintenance has been reviewed up to date.   Lab Results  Component Value Date   CREATININE 0.95 05/09/2019   BUN 14 05/09/2019   NA 140 05/09/2019   K 4.6 05/09/2019   CL 102 05/09/2019   CO2 24 05/09/2019   Lab Results  Component Value Date   CHOL 162 05/09/2019   HDL 53 05/09/2019   LDLCALC 97 05/09/2019   TRIG 59 05/09/2019   No results found for: TSH No results found for: HGBA1C Lab Results  Component Value Date   WBC 10.9 (H) 08/14/2015   HGB 12.4 08/14/2015   HCT 36.9 08/14/2015   MCV 95 08/14/2015   PLT 281 08/14/2015   No results found for: ALT, AST, GGT, ALKPHOS, BILITOT   Review of Systems  Constitutional: Negative.  Negative for chills, fatigue, fever and unexpected weight change.  HENT: Negative for congestion, ear discharge, ear pain, rhinorrhea, sinus pressure, sneezing and sore throat.   Eyes: Negative for double vision, photophobia, pain, discharge, redness and itching.  Respiratory: Negative for cough, shortness of breath, wheezing and stridor.   Gastrointestinal: Negative for abdominal pain, blood in stool, constipation, diarrhea, nausea and vomiting.  Endocrine: Negative for cold intolerance, heat intolerance, polydipsia, polyphagia and polyuria.  Genitourinary: Negative for dysuria, flank pain, frequency, hematuria, menstrual problem, pelvic pain, urgency, vaginal bleeding and vaginal discharge.  Musculoskeletal: Negative for arthralgias, back pain and myalgias.  Skin: Negative for rash.  Allergic/Immunologic: Negative for environmental allergies and food allergies.  Neurological: Negative for dizziness, weakness, light-headedness, numbness and headaches.   Hematological: Negative for adenopathy. Does not bruise/bleed easily.  Psychiatric/Behavioral: Negative for dysphoric mood. The patient is not nervous/anxious.     Patient Active Problem List   Diagnosis Date Noted  . Primary osteoarthritis of left knee 04/26/2018  . Morbid obesity with BMI of 40.0-44.9, adult (HCC) 03/23/2018    No Known Allergies  Past Surgical History:  Procedure Laterality Date  . CATARACT EXTRACTION Bilateral   . ptk surgery      Social History   Tobacco Use  . Smoking status: Never Smoker  . Smokeless tobacco: Never Used  Vaping Use  . Vaping Use: Never used  Substance Use Topics  . Alcohol use: No    Alcohol/week: 0.0 standard drinks  . Drug use: No     Medication list has been reviewed and updated.  Current Meds  Medication Sig  . B Complex Vitamins (VITAMIN B COMPLEX PO) Take 1 tablet by mouth daily.  Marland Kitchen loratadine (CLARITIN) 10 MG tablet Take 10 mg by mouth daily.   Current Facility-Administered Medications for the 10/23/20 encounter (Office Visit) with [name], MD  Medication  . albuterol (PROVENTIL) (2.5 MG/3ML) 0.083% nebulizer solution 2.5 mg    PHQ 2/9 Scores 10/23/2020 05/07/2019 05/31/2018 03/10/2018  PHQ - 2 Score 0 0 0 0  PHQ- 9 Score 0 - - 0    GAD 7 : Generalized Anxiety Score 10/23/2020  Nervous, Anxious, on Edge 0  Control/stop worrying 0  Worry too much - different things 0  Trouble relaxing 0  Restless 0  Easily annoyed or irritable 0  Afraid - awful might happen  0  Total GAD 7 Score 0    BP Readings from Last 3 Encounters:  10/23/20 138/80  05/09/19 128/62  05/07/19 (!) 126/55    Physical Exam Vitals and nursing note reviewed.  Constitutional:      Appearance: She is well-developed and well-nourished.  HENT:     Head: Normocephalic.     Right Ear: Tympanic membrane, ear canal and external ear normal.     Left Ear: Tympanic membrane, ear canal and external ear normal.     Mouth/Throat:     Mouth:  Oropharynx is clear and moist.  Eyes:     General: Lids are everted, no foreign bodies appreciated. No scleral icterus.       Left eye: No foreign body or hordeolum.     Extraocular Movements: EOM normal.     Conjunctiva/sclera: Conjunctivae normal.     Right eye: Right conjunctiva is not injected.     Left eye: Left conjunctiva is not injected.     Pupils: Pupils are equal, round, and reactive to light.  Neck:     Thyroid: No thyromegaly.     Vascular: No JVD.     Trachea: No tracheal deviation.  Cardiovascular:     Rate and Rhythm: Normal rate and regular rhythm.     Pulses: Intact distal pulses.     Heart sounds: Normal heart sounds. No murmur heard. No friction rub. No gallop.   Pulmonary:     Effort: Pulmonary effort is normal. No respiratory distress.     Breath sounds: Normal breath sounds. No wheezing or rales.  Abdominal:     General: Bowel sounds are normal.     Palpations: Abdomen is soft. There is no hepatosplenomegaly or mass.     Tenderness: There is no abdominal tenderness. There is no guarding or rebound.  Musculoskeletal:        General: No tenderness or edema. Normal range of motion.     Cervical back: Normal range of motion and neck supple.  Lymphadenopathy:     Cervical: No cervical adenopathy.  Skin:    General: Skin is warm.     Findings: No rash.  Neurological:     Mental Status: She is alert and oriented to person, place, and time.     Cranial Nerves: No cranial nerve deficit.     Deep Tendon Reflexes: Strength normal. Reflexes normal.  Psychiatric:        Mood and Affect: Mood and affect normal. Mood is not anxious or depressed.     Wt Readings from Last 3 Encounters:  10/23/20 254 lb (115.2 kg)  05/09/19 259 lb (117.5 kg)  05/07/19 257 lb (116.6 kg)    BP 138/80   Pulse 76   Ht 5\' 7"  (1.702 m)   Wt 254 lb (115.2 kg)   BMI 39.78 kg/m    Assessment and Plan: 1. Elevated blood-pressure reading, without diagnosis of hypertension Chronic.   Relatively controlled.  Stable.  Blood pressure is 138 over 80.  It was discussed with patient the new guidelines for blood pressure control and the realistic approach to this.  Given that the patient is also in a BMI range that could benefit from decrease caloric intake patient has been given a for both purposes. - Renal Function Panel  2. BMI 39.0-39.9,adult Chronic.  Persistent.  Stable.  Mild decrease since last visit.  But we will initiate Dash diet for both weight loss and blood pressure control. - Lipid Panel With  LDL/HDL Ratio  3. Healthcare maintenance Immunizations are reviewed and recommendations provided.   Age appropriate screening tests are discussed. Counseling given for risk factor reduction interventions.

## 2020-10-23 NOTE — Patient Instructions (Signed)
https://www.nhlbi.nih.gov/files/docs/public/heart/dash_brief.pdf">  DASH Eating Plan DASH stands for Dietary Approaches to Stop Hypertension. The DASH eating plan is a healthy eating plan that has been shown to:  Reduce high blood pressure (hypertension).  Reduce your risk for type 2 diabetes, heart disease, and stroke.  Help with weight loss. What are tips for following this plan? Reading food labels  Check food labels for the amount of salt (sodium) per serving. Choose foods with less than 5 percent of the Daily Value of sodium. Generally, foods with less than 300 milligrams (mg) of sodium per serving fit into this eating plan.  To find whole grains, look for the word "whole" as the first word in the ingredient list. Shopping  Buy products labeled as "low-sodium" or "no salt added."  Buy fresh foods. Avoid canned foods and pre-made or frozen meals. Cooking  Avoid adding salt when cooking. Use salt-free seasonings or herbs instead of table salt or sea salt. Check with your health care provider or pharmacist before using salt substitutes.  Do not fry foods. Cook foods using healthy methods such as baking, boiling, grilling, roasting, and broiling instead.  Cook with heart-healthy oils, such as olive, canola, avocado, soybean, or sunflower oil. Meal planning  Eat a balanced diet that includes: ? 4 or more servings of fruits and 4 or more servings of vegetables each day. Try to fill one-half of your plate with fruits and vegetables. ? 6-8 servings of whole grains each day. ? Less than 6 oz (170 g) of lean meat, poultry, or fish each day. A 3-oz (85-g) serving of meat is about the same size as a deck of cards. One egg equals 1 oz (28 g). ? 2-3 servings of low-fat dairy each day. One serving is 1 cup (237 mL). ? 1 serving of nuts, seeds, or beans 5 times each week. ? 2-3 servings of heart-healthy fats. Healthy fats called omega-3 fatty acids are found in foods such as walnuts,  flaxseeds, fortified milks, and eggs. These fats are also found in cold-water fish, such as sardines, salmon, and mackerel.  Limit how much you eat of: ? Canned or prepackaged foods. ? Food that is high in trans fat, such as some fried foods. ? Food that is high in saturated fat, such as fatty meat. ? Desserts and other sweets, sugary drinks, and other foods with added sugar. ? Full-fat dairy products.  Do not salt foods before eating.  Do not eat more than 4 egg yolks a week.  Try to eat at least 2 vegetarian meals a week.  Eat more home-cooked food and less restaurant, buffet, and fast food.   Lifestyle  When eating at a restaurant, ask that your food be prepared with less salt or no salt, if possible.  If you drink alcohol: ? Limit how much you use to:  0-1 drink a day for women who are not pregnant.  0-2 drinks a day for men. ? Be aware of how much alcohol is in your drink. In the [place] one drink equals one 12 oz bottle of beer (355 mL), one 5 oz glass of wine (148 mL), or one 1 oz glass of hard liquor (44 mL). General information  Avoid eating more than 2,300 mg of salt a day. If you have hypertension, you may need to reduce your sodium intake to 1,500 mg a day.  Work with your health care provider to maintain a healthy body weight or to lose weight. Ask what an ideal weight is for   you.  Get at least 30 minutes of exercise that causes your heart to beat faster (aerobic exercise) most days of the week. Activities may include walking, swimming, or biking.  Work with your health care provider or dietitian to adjust your eating plan to your individual calorie needs. What foods should I eat? Fruits All fresh, dried, or frozen fruit. Canned fruit in natural juice (without added sugar). Vegetables Fresh or frozen vegetables (raw, steamed, roasted, or grilled). Low-sodium or reduced-sodium tomato and vegetable juice. Low-sodium or reduced-sodium tomato sauce and tomato paste.  Low-sodium or reduced-sodium canned vegetables. Grains Whole-grain or whole-wheat bread. Whole-grain or whole-wheat pasta. Brown rice. Oatmeal. Quinoa. Bulgur. Whole-grain and low-sodium cereals. Pita bread. Low-fat, low-sodium crackers. Whole-wheat flour tortillas. Meats and other proteins Skinless chicken or turkey. Ground chicken or turkey. Pork with fat trimmed off. Fish and seafood. Egg whites. Dried beans, peas, or lentils. Unsalted nuts, nut butters, and seeds. Unsalted canned beans. Lean cuts of beef with fat trimmed off. Low-sodium, lean precooked or cured meat, such as sausages or meat loaves. Dairy Low-fat (1%) or fat-free (skim) milk. Reduced-fat, low-fat, or fat-free cheeses. Nonfat, low-sodium ricotta or cottage cheese. Low-fat or nonfat yogurt. Low-fat, low-sodium cheese. Fats and oils Soft margarine without trans fats. Vegetable oil. Reduced-fat, low-fat, or light mayonnaise and salad dressings (reduced-sodium). Canola, safflower, olive, avocado, soybean, and sunflower oils. Avocado. Seasonings and condiments Herbs. Spices. Seasoning mixes without salt. Other foods Unsalted popcorn and pretzels. Fat-free sweets. The items listed above may not be a complete list of foods and beverages you can eat. Contact a dietitian for more information. What foods should I avoid? Fruits Canned fruit in a light or heavy syrup. Fried fruit. Fruit in cream or butter sauce. Vegetables Creamed or fried vegetables. Vegetables in a cheese sauce. Regular canned vegetables (not low-sodium or reduced-sodium). Regular canned tomato sauce and paste (not low-sodium or reduced-sodium). Regular tomato and vegetable juice (not low-sodium or reduced-sodium). Pickles. Olives. Grains Baked goods made with fat, such as croissants, muffins, or some breads. Dry pasta or rice meal packs. Meats and other proteins Fatty cuts of meat. Ribs. Fried meat. Bacon. Bologna, salami, and other precooked or cured meats, such as  sausages or meat loaves. Fat from the back of a pig (fatback). Bratwurst. Salted nuts and seeds. Canned beans with added salt. Canned or smoked fish. Whole eggs or egg yolks. Chicken or turkey with skin. Dairy Whole or 2% milk, cream, and half-and-half. Whole or full-fat cream cheese. Whole-fat or sweetened yogurt. Full-fat cheese. Nondairy creamers. Whipped toppings. Processed cheese and cheese spreads. Fats and oils Butter. Stick margarine. Lard. Shortening. Ghee. Bacon fat. Tropical oils, such as coconut, palm kernel, or palm oil. Seasonings and condiments Onion salt, garlic salt, seasoned salt, table salt, and sea salt. Worcestershire sauce. Tartar sauce. Barbecue sauce. Teriyaki sauce. Soy sauce, including reduced-sodium. Steak sauce. Canned and packaged gravies. Fish sauce. Oyster sauce. Cocktail sauce. Store-bought horseradish. Ketchup. Mustard. Meat flavorings and tenderizers. Bouillon cubes. Hot sauces. Pre-made or packaged marinades. Pre-made or packaged taco seasonings. Relishes. Regular salad dressings. Other foods Salted popcorn and pretzels. The items listed above may not be a complete list of foods and beverages you should avoid. Contact a dietitian for more information. Where to find more information  National Heart, Lung, and Blood Institute: www.nhlbi.nih.gov  American Heart Association: www.heart.org  Academy of Nutrition and Dietetics: www.eatright.org  National Kidney Foundation: www.kidney.org Summary  The DASH eating plan is a healthy eating plan that has been shown to   reduce high blood pressure (hypertension). It may also reduce your risk for type 2 diabetes, heart disease, and stroke.  When on the DASH eating plan, aim to eat more fresh fruits and vegetables, whole grains, lean proteins, low-fat dairy, and heart-healthy fats.  With the DASH eating plan, you should limit salt (sodium) intake to 2,300 mg a day. If you have hypertension, you may need to reduce your  sodium intake to 1,500 mg a day.  Work with your health care provider or dietitian to adjust your eating plan to your individual calorie needs. This information is not intended to replace advice given to you by your health care provider. Make sure you discuss any questions you have with your health care provider. Document Revised: 08/31/2019 Document Reviewed: 08/31/2019 Elsevier Patient Education  2021 Elsevier Inc. Managing Your Hypertension Hypertension, also called high blood pressure, is when the force of the blood pressing against the walls of the arteries is too strong. Arteries are blood vessels that carry blood from your heart throughout your body. Hypertension forces the heart to work harder to pump blood and may cause the arteries to become narrow or stiff. Understanding blood pressure readings Your personal target blood pressure may vary depending on your medical conditions, your age, and other factors. A blood pressure reading includes a higher number over a lower number. Ideally, your blood pressure should be below 120/80. You should know that:  The first, or top, number is called the systolic pressure. It is a measure of the pressure in your arteries as your heart beats.  The second, or bottom number, is called the diastolic pressure. It is a measure of the pressure in your arteries as the heart relaxes. Blood pressure is classified into four stages. Based on your blood pressure reading, your health care provider may use the following stages to determine what type of treatment you need, if any. Systolic pressure and diastolic pressure are measured in a unit called mmHg. Normal  Systolic pressure: below 120.  Diastolic pressure: below 80. Elevated  Systolic pressure: 120-129.  Diastolic pressure: below 80. Hypertension stage 1  Systolic pressure: 130-139.  Diastolic pressure: 80-89. Hypertension stage 2  Systolic pressure: 140 or above.  Diastolic pressure: 90 or  above. How can this condition affect me? Managing your hypertension is an important responsibility. Over time, hypertension can damage the arteries and decrease blood flow to important parts of the body, including the brain, heart, and kidneys. Having untreated or uncontrolled hypertension can lead to:  A heart attack.  A stroke.  A weakened blood vessel (aneurysm).  Heart failure.  Kidney damage.  Eye damage.  Metabolic syndrome.  Memory and concentration problems.  Vascular dementia. What actions can I take to manage this condition? Hypertension can be managed by making lifestyle changes and possibly by taking medicines. Your health care provider will help you make a plan to bring your blood pressure within a normal range. Nutrition  Eat a diet that is high in fiber and potassium, and low in salt (sodium), added sugar, and fat. An example eating plan is called the Dietary Approaches to Stop Hypertension (DASH) diet. To eat this way: ? Eat plenty of fresh fruits and vegetables. Try to fill one-half of your plate at each meal with fruits and vegetables. ? Eat whole grains, such as whole-wheat pasta, brown rice, or whole-grain bread. Fill about one-fourth of your plate with whole grains. ? Eat low-fat dairy products. ? Avoid fatty cuts of meat, processed or cured  meats, and poultry with skin. Fill about one-fourth of your plate with lean proteins such as fish, chicken without skin, beans, eggs, and tofu. ? Avoid pre-made and processed foods. These tend to be higher in sodium, added sugar, and fat.  Reduce your daily sodium intake. Most people with hypertension should eat less than 1,500 mg of sodium a day.   Lifestyle  Work with your health care provider to maintain a healthy body weight or to lose weight. Ask what an ideal weight is for you.  Get at least 30 minutes of exercise that causes your heart to beat faster (aerobic exercise) most days of the week. Activities may include  walking, swimming, or biking.  Include exercise to strengthen your muscles (resistance exercise), such as weight lifting, as part of your weekly exercise routine. Try to do these types of exercises for 30 minutes at least 3 days a week.  Do not use any products that contain nicotine or tobacco, such as cigarettes, e-cigarettes, and chewing tobacco. If you need help quitting, ask your health care provider.  Control any long-term (chronic) conditions you have, such as high cholesterol or diabetes.  Identify your sources of stress and find ways to manage stress. This may include meditation, deep breathing, or making time for fun activities.   Alcohol use  Do not drink alcohol if: ? Your health care provider tells you not to drink. ? You are pregnant, may be pregnant, or are planning to become pregnant.  If you drink alcohol: ? Limit how much you use to:  0-1 drink a day for women.  0-2 drinks a day for men. ? Be aware of how much alcohol is in your drink. In the [place] one drink equals one 12 oz bottle of beer (355 mL), one 5 oz glass of wine (148 mL), or one 1 oz glass of hard liquor (44 mL). Medicines Your health care provider may prescribe medicine if lifestyle changes are not enough to get your blood pressure under control and if:  Your systolic blood pressure is 130 or higher.  Your diastolic blood pressure is 80 or higher. Take medicines only as told by your health care provider. Follow the directions carefully. Blood pressure medicines must be taken as told by your health care provider. The medicine does not work as well when you skip doses. Skipping doses also puts you at risk for problems. Monitoring Before you monitor your blood pressure:  Do not smoke, drink caffeinated beverages, or exercise within 30 minutes before taking a measurement.  Use the bathroom and empty your bladder (urinate).  Sit quietly for at least 5 minutes before taking measurements. Monitor your blood  pressure at home as told by your health care provider. To do this:  Sit with your back straight and supported.  Place your feet flat on the floor. Do not cross your legs.  Support your arm on a flat surface, such as a table. Make sure your upper arm is at heart level.  Each time you measure, take two or three readings one minute apart and record the results. You may also need to have your blood pressure checked regularly by your health care provider.   General information  Talk with your health care provider about your diet, exercise habits, and other lifestyle factors that may be contributing to hypertension.  Review all the medicines you take with your health care provider because there may be side effects or interactions.  Keep all visits as told by your  health care provider. Your health care provider can help you create and adjust your plan for managing your high blood pressure. Where to find more information  National Heart, Lung, and Blood Institute: www.nhlbi.nih.gov  American Heart Association: www.heart.org Contact a health care provider if:  You think you are having a reaction to medicines you have taken.  You have repeated (recurrent) headaches.  You feel dizzy.  You have swelling in your ankles.  You have trouble with your vision. Get help right away if:  You develop a severe headache or confusion.  You have unusual weakness or numbness, or you feel faint.  You have severe pain in your chest or abdomen.  You vomit repeatedly.  You have trouble breathing. These symptoms may represent a serious problem that is an emergency. Do not wait to see if the symptoms will go away. Get medical help right away. Call your local emergency services (911 in the [place] Do not drive yourself to the hospital. Summary  Hypertension is when the force of blood pumping through your arteries is too strong. If this condition is not controlled, it may put you at risk for serious  complications.  Your personal target blood pressure may vary depending on your medical conditions, your age, and other factors. For most people, a normal blood pressure is less than 120/80.  Hypertension is managed by lifestyle changes, medicines, or both.  Lifestyle changes to help manage hypertension include losing weight, eating a healthy, low-sodium diet, exercising more, stopping smoking, and limiting alcohol. This information is not intended to replace advice given to you by your health care provider. Make sure you discuss any questions you have with your health care provider. Document Revised: 11/02/2019 Document Reviewed: 08/28/2019 Elsevier Patient Education  2021 Elsevier Inc.  

## 2020-10-24 LAB — RENAL FUNCTION PANEL
Albumin: 4.6 g/dL (ref 3.8–4.8)
BUN/Creatinine Ratio: 15 (ref 12–28)
BUN: 14 mg/dL (ref 8–27)
CO2: 23 mmol/L (ref 20–29)
Calcium: 9.8 mg/dL (ref 8.7–10.3)
Chloride: 105 mmol/L (ref 96–106)
Creatinine, Ser: 0.96 mg/dL (ref 0.57–1.00)
GFR calc Af Amer: 71 mL/min/{1.73_m2} (ref >59)
GFR calc non Af Amer: 61 mL/min/{1.73_m2} (ref >59)
Glucose: 103 mg/dL — ABNORMAL HIGH (ref 65–99)
Phosphorus: 3.2 mg/dL (ref 3.0–4.3)
Potassium: 4.7 mmol/L (ref 3.5–5.2)
Sodium: 141 mmol/L (ref 134–144)

## 2020-10-24 LAB — LIPID PANEL WITH LDL/HDL RATIO
Cholesterol, Total: 187 mg/dL (ref 100–199)
HDL: 57 mg/dL (ref >39)
LDL Chol Calc (NIH): 112 mg/dL — ABNORMAL HIGH (ref 0–99)
LDL/HDL Ratio: 2 ratio (ref 0.0–3.2)
Triglycerides: 100 mg/dL (ref 0–149)
VLDL Cholesterol Cal: 18 mg/dL (ref 5–40)

## 2021-07-06 ENCOUNTER — Telehealth: Payer: Self-pay

## 2021-07-06 NOTE — Telephone Encounter (Signed)
Called to schedule AWV left message for patient to call PCP office for appointment

## 2021-07-21 DIAGNOSIS — Z961 Presence of intraocular lens: Secondary | ICD-10-CM | POA: Diagnosis not present

## 2021-09-05 LAB — FECAL OCCULT BLOOD, IMMUNOCHEMICAL: IFOBT: NEGATIVE

## 2021-10-13 ENCOUNTER — Encounter: Payer: Self-pay | Admitting: Family Medicine

## 2021-10-27 ENCOUNTER — Other Ambulatory Visit: Payer: Self-pay

## 2021-10-27 ENCOUNTER — Encounter: Payer: Self-pay | Admitting: Family Medicine

## 2021-10-27 ENCOUNTER — Ambulatory Visit (INDEPENDENT_AMBULATORY_CARE_PROVIDER_SITE_OTHER): Payer: Medicare HMO | Admitting: Family Medicine

## 2021-10-27 VITALS — BP 122/80 | HR 80 | Ht 67.0 in | Wt 248.0 lb

## 2021-10-27 DIAGNOSIS — Z1231 Encounter for screening mammogram for malignant neoplasm of breast: Secondary | ICD-10-CM

## 2021-10-27 DIAGNOSIS — Z8739 Personal history of other diseases of the musculoskeletal system and connective tissue: Secondary | ICD-10-CM

## 2021-10-27 DIAGNOSIS — Z6838 Body mass index (BMI) 38.0-38.9, adult: Secondary | ICD-10-CM | POA: Diagnosis not present

## 2021-10-27 DIAGNOSIS — E7801 Familial hypercholesterolemia: Secondary | ICD-10-CM | POA: Diagnosis not present

## 2021-10-27 DIAGNOSIS — R030 Elevated blood-pressure reading, without diagnosis of hypertension: Secondary | ICD-10-CM

## 2021-10-27 NOTE — Patient Instructions (Addendum)
Acute Torticollis, Adult Torticollis is a condition in which the muscles of the neck tighten (contract) abnormally, causing the neck to twist and the head to move into an unnatural position. Torticollis that develops suddenly is called acute torticollis. People with acute torticollis may have trouble turning their head. The condition can be painful and may range from mild to severe. What are the causes? This condition may be caused by: Sleeping in an awkward position. This is common. Extending or twisting the neck muscles beyond their normal position. An injury to the neck muscles. An infection. A tumor. Certain medicines. Long-lasting spasms of the neck muscles. In some cases, the cause may not be known. What increases the risk? You are more likely to develop this condition if: You have a condition associated with loose ligaments, such as Down syndrome. You have a brain condition that affects vision, such as strabismus. What are the signs or symptoms? The main symptom of this condition is tilting of the head to one side. Other symptoms include: Pain in the neck. Trouble turning the head from side to side or up and down. How is this diagnosed? This condition may be diagnosed based on: A physical exam. Your medical history. Imaging tests, such as: An X-ray. An ultrasound. A CT scan. An MRI. How is this treated? Treatment for this condition depends on what is causing the condition. Mild cases may go away without treatment. Treatment for more serious cases may include: Medicines or shots to relax the muscles. Other medicines, such as antibiotics, to treat the underlying cause. Wearing a soft neck collar. Physical therapy and stretching exercises to improve movement and strength in your neck. Neck massage. In severe cases, surgery may be needed to repair dislocated or broken bones or to treat nerves in the neck. Follow these instructions at home:  Take over-the-counter and  prescription medicines only as told by your health care provider. Do stretching exercises and massage your neck as told by your health care provider. If directed, apply heat to the affected area as often as told by your health care provider. Use the heat source that your health care provider recommends, such as a moist heat pack or a heating pad. Place a towel between your skin and the heat source. Leave the heat on for 20-30 minutes. Remove the heat if your skin turns bright red. This is especially important if you are unable to feel pain, heat, or cold. You have a greater risk of getting burned. If you wake up with torticollis after sleeping, check your bed or sleeping area. Look for lumpy pillows or unusual objects. Make sure your bed and sleeping area are comfortable. Keep all follow-up visits. This is important. Contact a health care provider if: You have a fever. Your symptoms do not improve or they get worse. Get help right away if: You have trouble breathing. You make loud, high-pitched sounds when you breathe, most often when you breathe in (stridor). You start to drool. You have trouble swallowing or pain when swallowing. You develop numbness or weakness in your hands or feet. You have changes in your speech, understanding, or vision. You are in severe pain. You cannot move your head or neck. These symptoms may represent a serious problem that is an emergency. Do not wait to see if the symptoms will go away. Get medical help right away. Call your local emergency services (911 in the [place] Do not drive yourself to the hospital. Summary Torticollis is a condition in which the  muscles of the neck tighten (contract) abnormally, causing the neck to twist and the head to move into an unnatural position. Torticollis that develops suddenly is called acute torticollis. Treatment for this condition depends on what is causing the condition. Mild cases may go away without treatment. Do  stretching exercises and massage your neck as told by your health care provider. You may also be instructed to apply heat to the area. Contact your health care provider if your symptoms do not improve or they get worse. This information is not intended to replace advice given to you by your health care provider. Make sure you discuss any questions you have with your health care provider. Document Revised: 01/25/2020 Document Reviewed: 01/25/2020 Elsevier Patient Education  2022 [employer]    Why follow it? Research shows Those who follow the Mediterranean diet have a reduced risk of heart disease  The diet is associated with a reduced incidence of Parkinson's and Alzheimer's diseases People following the diet may have longer life expectancies and lower rates of chronic diseases  The Dietary Guidelines for Americans recommends the Mediterranean diet as an eating plan to promote health and prevent disease  What Is the Mediterranean Diet?  Healthy eating plan based on typical foods and recipes of Mediterranean-style cooking The diet is primarily a plant based diet; these foods should make up a majority of meals   Starches - Plant based foods should make up a majority of meals - They are an important sources of vitamins, minerals, energy, antioxidants, and fiber - Choose whole grains, foods high in fiber and minimally processed items  - Typical grain sources include wheat, oats, barley, corn, brown rice, bulgar, farro, millet, polenta, couscous  - Various types of beans include chickpeas, lentils, fava beans, black beans, white beans   Fruits  Veggies - Large quantities of antioxidant rich fruits & veggies; 6 or more servings  - Vegetables can be eaten raw or lightly drizzled with oil and cooked  - Vegetables common to the traditional Mediterranean Diet include: artichokes, arugula, beets, broccoli, brussel sprouts, cabbage, carrots, celery, collard greens, cucumbers, eggplant, kale, leeks,  lemons, lettuce, mushrooms, okra, onions, peas, peppers, potatoes, pumpkin, radishes, rutabaga, shallots, spinach, sweet potatoes, turnips, zucchini - Fruits common to the Mediterranean Diet include: apples, apricots, avocados, cherries, clementines, dates, figs, grapefruits, grapes, melons, nectarines, oranges, peaches, pears, pomegranates, strawberries, tangerines  Fats - Replace butter and margarine with healthy oils, such as olive oil, canola oil, and tahini  - Limit nuts to no more than a handful a day  - Nuts include walnuts, almonds, pecans, pistachios, pine nuts  - Limit or avoid candied, honey roasted or heavily salted nuts - Olives are central to the [employer] - can be eaten whole or used in a variety of dishes   Meats Protein - Limiting red meat: no more than a few times a month - When eating red meat: choose lean cuts and keep the portion to the size of deck of cards - Eggs: approx. 0 to 4 times a week  - Fish and lean poultry: at least 2 a week  - Healthy protein sources include, chicken, [place], lean beef, lamb - Increase intake of seafood such as tuna, salmon, trout, mackerel, shrimp, scallops - Avoid or limit high fat processed meats such as sausage and bacon  Dairy - Include moderate amounts of low fat dairy products  - Focus on healthy dairy such as fat free yogurt, skim milk, low or reduced fat cheese -  Limit dairy products higher in fat such as whole or 2% milk, cheese, ice cream  Alcohol - Moderate amounts of red wine is ok  - No more than 5 oz daily for women (all ages) and men older than age 63  - No more than 10 oz of wine daily for men younger than 35  Other - Limit sweets and other desserts  - Use herbs and spices instead of salt to flavor foods  - Herbs and spices common to the traditional Mediterranean Diet include: basil, bay leaves, chives, cloves, cumin, fennel, garlic, lavender, marjoram, mint, oregano, parsley, pepper, rosemary, sage, savory, sumac,  tarragon, thyme   Its not just a diet, its a lifestyle:  The Mediterranean diet includes lifestyle factors typical of those in the region  Foods, drinks and meals are best eaten with others and savored Daily physical activity is important for overall good health This could be strenuous exercise like running and aerobics This could also be more leisurely activities such as walking, housework, yard-work, or taking the stairs Moderation is the key; a balanced and healthy diet accommodates most foods and drinks Consider portion sizes and frequency of consumption of certain foods   Meal Ideas & Options:  Breakfast:  Whole wheat toast or whole wheat English muffins with peanut butter & hard boiled egg Steel cut oats topped with apples & cinnamon and skim milk  Fresh fruit: banana, strawberries, melon, berries, peaches  Smoothies: strawberries, bananas, greek yogurt, peanut butter Low fat greek yogurt with blueberries and granola  Egg white omelet with spinach and mushrooms Breakfast couscous: whole wheat couscous, apricots, skim milk, cranberries  Sandwiches:  Hummus and grilled vegetables (peppers, zucchini, squash) on whole wheat bread   Grilled chicken on whole wheat pita with lettuce, tomatoes, cucumbers or tzatziki  [place] salad on whole wheat bread: tuna salad made with greek yogurt, olives, red peppers, capers, green onions Garlic rosemary lamb pita: lamb sauted with garlic, rosemary, salt & pepper; add lettuce, cucumber, greek yogurt to pita - flavor with lemon juice and black pepper  Seafood:  Mediterranean grilled salmon, seasoned with garlic, basil, parsley, lemon juice and black pepper Shrimp, lemon, and spinach whole-grain pasta salad made with low fat greek yogurt  Seared scallops with lemon orzo  Seared tuna steaks seasoned salt, pepper, coriander topped with tomato mixture of olives, tomatoes, olive oil, minced garlic, parsley, green onions and cappers  Meats:  Herbed greek  chicken salad with kalamata olives, cucumber, feta  Red bell peppers stuffed with spinach, bulgur, lean ground beef (or lentils) & topped with feta   Kebabs: skewers of chicken, tomatoes, onions, zucchini, squash  [place] burgers: made with red onions, mint, dill, lemon juice, feta cheese topped with roasted red peppers Vegetarian Cucumber salad: cucumbers, artichoke hearts, celery, red onion, feta cheese, tossed in olive oil & lemon juice  Hummus and whole grain pita points with a greek salad (lettuce, tomato, feta, olives, cucumbers, red onion) Lentil soup with celery, carrots made with vegetable broth, garlic, salt and pepper  Tabouli salad: parsley, bulgur, mint, scallions, cucumbers, tomato, radishes, lemon juice, olive oil, salt and pepper.

## 2021-10-27 NOTE — Progress Notes (Signed)
Date:  10/27/2021   Name:  Miranda Jimenez   DOB:  December 21, 1950   MRN:  [id number]   Chief Complaint: Health Maintenance and breast exam  Patient is a 71 year old female who presents for a medical maintenance exam. The patient reports the following problems: none. Health maintenance has been reviewed mammogram.     Lab Results  Component Value Date   NA 141 10/23/2020   K 4.7 10/23/2020   CO2 23 10/23/2020   GLUCOSE 103 (H) 10/23/2020   BUN 14 10/23/2020   CREATININE 0.96 10/23/2020   CALCIUM 9.8 10/23/2020   GFRNONAA 61 10/23/2020   Lab Results  Component Value Date   CHOL 187 10/23/2020   HDL 57 10/23/2020   LDLCALC 112 (H) 10/23/2020   TRIG 100 10/23/2020   No results found for: TSH No results found for: HGBA1C Lab Results  Component Value Date   WBC 10.9 (H) 08/14/2015   HGB 12.4 08/14/2015   HCT 36.9 08/14/2015   MCV 95 08/14/2015   PLT 281 08/14/2015   No results found for: ALT, AST, GGT, ALKPHOS, BILITOT No results found for: 25OHVITD2, 25OHVITD3, VD25OH   Review of Systems  Constitutional:  Negative for chills and fever.  HENT:  Negative for drooling, ear discharge, ear pain and sore throat.   Respiratory:  Negative for cough, shortness of breath and wheezing.   Cardiovascular:  Negative for chest pain, palpitations and leg swelling.  Gastrointestinal:  Negative for abdominal pain, blood in stool, constipation, diarrhea and nausea.  Endocrine: Negative for polydipsia.  Genitourinary:  Negative for dysuria, frequency, hematuria and urgency.  Musculoskeletal:  Positive for arthralgias. Negative for back pain, myalgias and neck pain.  Skin: Negative.  Negative for rash.  Allergic/Immunologic: Negative for environmental allergies.  Neurological:  Negative for dizziness and headaches.  Hematological:  Does not bruise/bleed easily.  Psychiatric/Behavioral:  Negative for suicidal ideas. The patient is not nervous/anxious.    Patient Active Problem List    Diagnosis Date Noted   Primary osteoarthritis of left knee 04/26/2018   Morbid obesity with BMI of 40.0-44.9, adult (HCC) 03/23/2018    No Known Allergies  Past Surgical History:  Procedure Laterality Date   CATARACT EXTRACTION Bilateral    ptk surgery      Social History   Tobacco Use   Smoking status: Never   Smokeless tobacco: Never  Vaping Use   Vaping Use: Never used  Substance Use Topics   Alcohol use: No    Alcohol/week: 0.0 standard drinks   Drug use: No     Medication list has been reviewed and updated.  Current Meds  Medication Sig   loratadine (CLARITIN) 10 MG tablet Take 10 mg by mouth daily.   Multiple Vitamins-Minerals (ONE-A-DAY WOMENS 50+ PO) Take 1 tablet by mouth daily.   [DISCONTINUED] B Complex Vitamins (VITAMIN B COMPLEX PO) Take 1 tablet by mouth daily.   Current Facility-Administered Medications for the 10/27/21 encounter (Office Visit) with [name], MD  Medication   albuterol (PROVENTIL) (2.5 MG/3ML) 0.083% nebulizer solution 2.5 mg    PHQ 2/9 Scores 10/27/2021 10/23/2020 05/07/2019 05/31/2018  PHQ - 2 Score 0 0 0 0  PHQ- 9 Score 0 0 - -    GAD 7 : Generalized Anxiety Score 10/27/2021 10/23/2020  Nervous, Anxious, on Edge 0 0  Control/stop worrying 0 0  Worry too much - different things 0 0  Trouble relaxing 0 0  Restless 0 0  Easily  annoyed or irritable 0 0  Afraid - awful might happen 0 0  Total GAD 7 Score 0 0  Anxiety Difficulty Not difficult at all -    BP Readings from Last 3 Encounters:  10/27/21 122/80  10/23/20 138/80  05/09/19 128/62    Physical Exam Vitals and nursing note reviewed.  Constitutional:      Appearance: She is well-developed.  HENT:     Head: Normocephalic.     Right Ear: Tympanic membrane, ear canal and external ear normal.     Left Ear: Tympanic membrane, ear canal and external ear normal.     Nose: Nose normal.     Mouth/Throat:     Mouth: Mucous membranes are moist.     Pharynx: No  oropharyngeal exudate or posterior oropharyngeal erythema.  Eyes:     General: Lids are everted, no foreign bodies appreciated. No scleral icterus.       Left eye: No foreign body or hordeolum.     Conjunctiva/sclera: Conjunctivae normal.     Right eye: Right conjunctiva is not injected.     Left eye: Left conjunctiva is not injected.     Pupils: Pupils are equal, round, and reactive to light.  Neck:     Thyroid: No thyromegaly.     Vascular: No carotid bruit or JVD.     Trachea: No tracheal deviation.  Cardiovascular:     Rate and Rhythm: Normal rate and regular rhythm.     Heart sounds: Normal heart sounds. No murmur heard.   No friction rub. No gallop.  Pulmonary:     Effort: Pulmonary effort is normal. No respiratory distress.     Breath sounds: Normal breath sounds. No wheezing, rhonchi or rales.  Chest:     Chest wall: No tenderness.  Breasts:    Right: No swelling, bleeding, inverted nipple, mass, nipple discharge, skin change or tenderness.     Left: No swelling, bleeding, inverted nipple, mass, nipple discharge, skin change or tenderness.  Abdominal:     General: Bowel sounds are normal.     Palpations: Abdomen is soft. There is no mass.     Tenderness: There is no abdominal tenderness. There is no guarding or rebound.  Musculoskeletal:        General: No tenderness. Normal range of motion.     Cervical back: Normal range of motion and neck supple. No rigidity or tenderness.     Right lower leg: No edema.     Left lower leg: No edema.  Lymphadenopathy:     Cervical: No cervical adenopathy.     Upper Body:     Right upper body: No supraclavicular adenopathy.     Left upper body: No supraclavicular adenopathy.  Skin:    General: Skin is warm.     Capillary Refill: Capillary refill takes less than 2 seconds.     Findings: No rash.  Neurological:     Mental Status: She is alert and oriented to person, place, and time.     Cranial Nerves: No cranial nerve deficit.      Motor: No weakness.     Deep Tendon Reflexes: Reflexes normal.  Psychiatric:        Mood and Affect: Mood is not anxious or depressed.    Wt Readings from Last 3 Encounters:  10/27/21 248 lb (112.5 kg)  10/23/20 254 lb (115.2 kg)  05/09/19 259 lb (117.5 kg)    BP 122/80    Pulse 80  Ht 5\' 7"  (1.702 m)    Wt 248 lb (112.5 kg)    BMI 38.84 kg/m   Assessment and Plan:  1. Familial hypercholesterolemia Chronic.  Mild.  Stable.  Mild elevation of LDL which is currently diet controlled.  Patient given Mediterranean diet follow-up and we will check lipid panel for current level of control. - Lipid Panel With LDL/HDL Ratio  2. Elevated blood-pressure reading, without diagnosis of hypertension Chronic.  Controlled.  Stable.  Blood pressure today is 122/80.  Patient continues to do well with limiting her sodium intake and progressive weight loss.  We will check renal function panel for current level of GFR controlled. - Renal Function Panel  3. Breast cancer screening by mammogram Breast exam is normal without palpable masses.  We will schedule for mammogram. - MM 3D SCREEN BREAST BILATERAL  4. BMI 38.0-38.9,adult Chronic.  Controlled.  Stable.  Patient is to gradually losing weight by design and continue to encourage weight loss.  5. History of torticollis Chronic.  Episodic.  Use the right-sided.  Occurs when patient is yawning sometimes.  Massage and information  provided for episodes.

## 2021-10-28 LAB — RENAL FUNCTION PANEL
Albumin: 4.5 g/dL (ref 3.8–4.8)
BUN/Creatinine Ratio: 19 (ref 12–28)
BUN: 19 mg/dL (ref 8–27)
CO2: 23 mmol/L (ref 20–29)
Calcium: 10 mg/dL (ref 8.7–10.3)
Chloride: 105 mmol/L (ref 96–106)
Creatinine, Ser: 1.02 mg/dL — ABNORMAL HIGH (ref 0.57–1.00)
Glucose: 103 mg/dL — ABNORMAL HIGH (ref 70–99)
Phosphorus: 3.4 mg/dL (ref 3.0–4.3)
Potassium: 5.2 mmol/L (ref 3.5–5.2)
Sodium: 142 mmol/L (ref 134–144)
eGFR: 60 mL/min/{1.73_m2} (ref >59)

## 2021-10-28 LAB — LIPID PANEL WITH LDL/HDL RATIO
Cholesterol, Total: 185 mg/dL (ref 100–199)
HDL: 57 mg/dL (ref >39)
LDL Chol Calc (NIH): 112 mg/dL — ABNORMAL HIGH (ref 0–99)
LDL/HDL Ratio: 2 ratio (ref 0.0–3.2)
Triglycerides: 88 mg/dL (ref 0–149)
VLDL Cholesterol Cal: 16 mg/dL (ref 5–40)

## 2021-11-18 ENCOUNTER — Encounter: Payer: Self-pay | Admitting: Family Medicine

## 2021-11-18 ENCOUNTER — Ambulatory Visit
Admission: RE | Admit: 2021-11-18 | Discharge: 2021-11-18 | Disposition: A | Discharge status: Home / Self Care | Payer: Medicare HMO | Source: Ambulatory Visit | Attending: Family Medicine | Admitting: Family Medicine

## 2021-11-18 ENCOUNTER — Other Ambulatory Visit: Payer: Self-pay

## 2021-11-18 DIAGNOSIS — Z1231 Encounter for screening mammogram for malignant neoplasm of breast: Secondary | ICD-10-CM | POA: Insufficient documentation

## 2021-11-19 ENCOUNTER — Other Ambulatory Visit: Payer: Self-pay | Admitting: Family Medicine

## 2021-11-19 DIAGNOSIS — R928 Other abnormal and inconclusive findings on diagnostic imaging of breast: Secondary | ICD-10-CM

## 2021-11-19 DIAGNOSIS — N6489 Other specified disorders of breast: Secondary | ICD-10-CM

## 2021-11-19 NOTE — Progress Notes (Signed)
Informed pt with results per Dr.Jones. Pt verbalized understanding. Gave pt number to call [phone] for insurance questions.  KP

## 2021-11-26 ENCOUNTER — Other Ambulatory Visit: Payer: Self-pay | Admitting: Family Medicine

## 2021-11-26 ENCOUNTER — Ambulatory Visit
Admission: RE | Admit: 2021-11-26 | Discharge: 2021-11-26 | Disposition: A | Discharge status: Home / Self Care | Payer: Medicare HMO | Source: Ambulatory Visit | Attending: Family Medicine | Admitting: Family Medicine

## 2021-11-26 ENCOUNTER — Other Ambulatory Visit: Payer: Self-pay

## 2021-11-26 DIAGNOSIS — R922 Inconclusive mammogram: Secondary | ICD-10-CM | POA: Diagnosis not present

## 2021-11-26 DIAGNOSIS — N6489 Other specified disorders of breast: Secondary | ICD-10-CM | POA: Insufficient documentation

## 2021-11-26 DIAGNOSIS — R928 Other abnormal and inconclusive findings on diagnostic imaging of breast: Secondary | ICD-10-CM | POA: Insufficient documentation

## 2021-12-01 ENCOUNTER — Encounter: Payer: Self-pay | Admitting: Family Medicine

## 2021-12-09 ENCOUNTER — Other Ambulatory Visit: Payer: Self-pay

## 2021-12-09 ENCOUNTER — Ambulatory Visit
Admission: RE | Admit: 2021-12-09 | Discharge: 2021-12-09 | Disposition: A | Discharge status: Home / Self Care | Payer: Medicare HMO | Source: Ambulatory Visit | Attending: Family Medicine | Admitting: Family Medicine

## 2021-12-09 DIAGNOSIS — R928 Other abnormal and inconclusive findings on diagnostic imaging of breast: Secondary | ICD-10-CM | POA: Insufficient documentation

## 2021-12-09 DIAGNOSIS — N6031 Fibrosclerosis of right breast: Secondary | ICD-10-CM | POA: Diagnosis not present

## 2021-12-09 DIAGNOSIS — N6489 Other specified disorders of breast: Secondary | ICD-10-CM | POA: Diagnosis not present

## 2021-12-09 HISTORY — PX: BREAST BIOPSY: SHX20

## 2021-12-10 LAB — SURGICAL PATHOLOGY

## 2021-12-30 ENCOUNTER — Telehealth: Payer: Self-pay

## 2021-12-30 NOTE — Telephone Encounter (Signed)
Patient refused to have a wellness visit set up at this time and will need to think about it first. ?

## 2022-06-02 IMAGING — MG MM BREAST BX W/ LOC DEV 1ST LESION IMAGE BX SPEC STEREO GUIDE*R*
8 of 28 series · 8 of 40 positions shown · non-contrast
Comparison: Previous exams.
COMPARISON: Previous exams.

Addendum:
CLINICAL DATA: Right breast asymmetry for biopsy

EXAM:
Right BREAST STEREOTACTIC CORE NEEDLE BIOPSY

[R (1 of 8)]
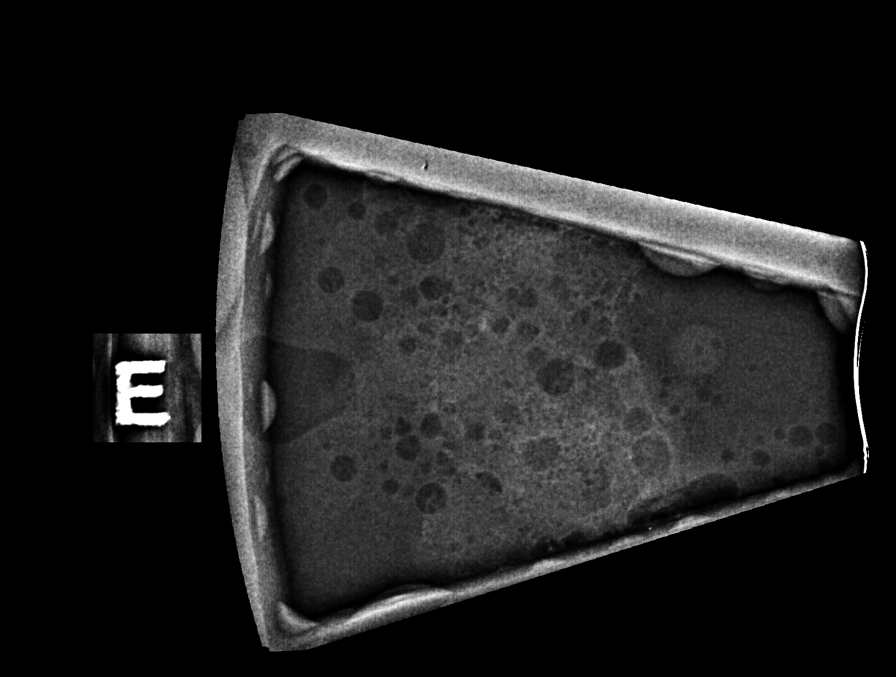

[R (2 of 8)]
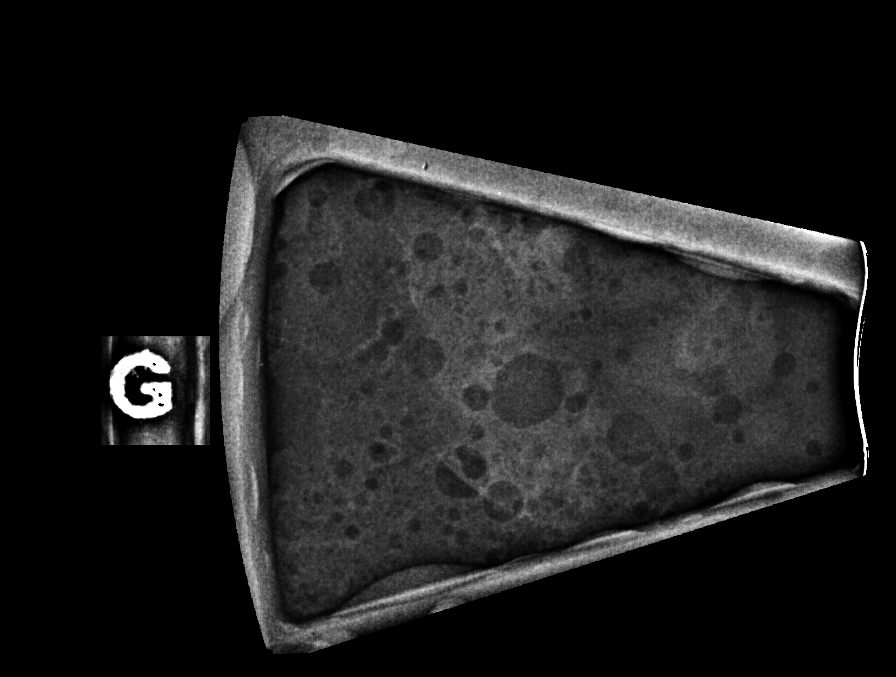

[R (3 of 8)]
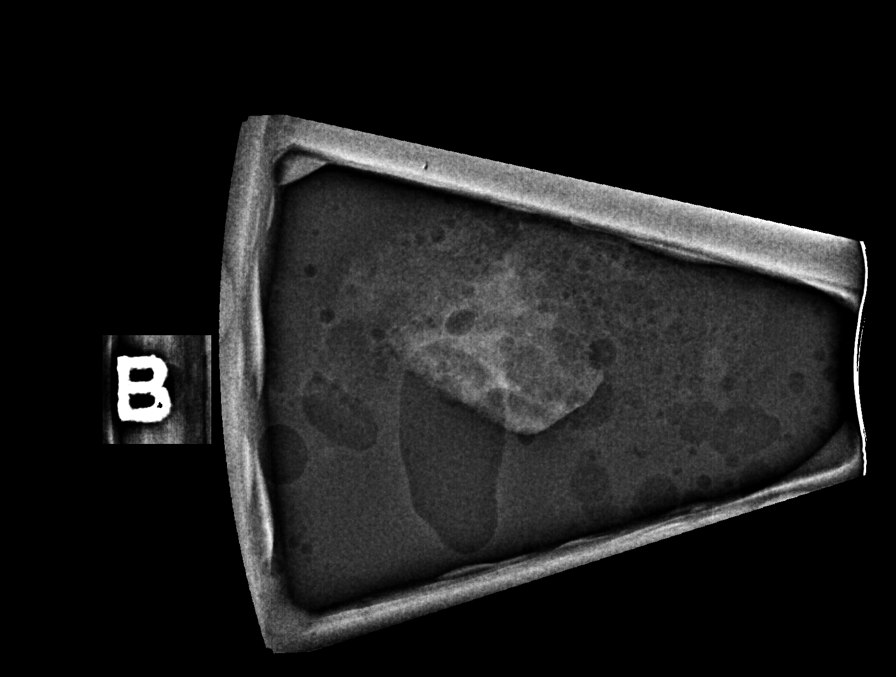

[R (4 of 8)]
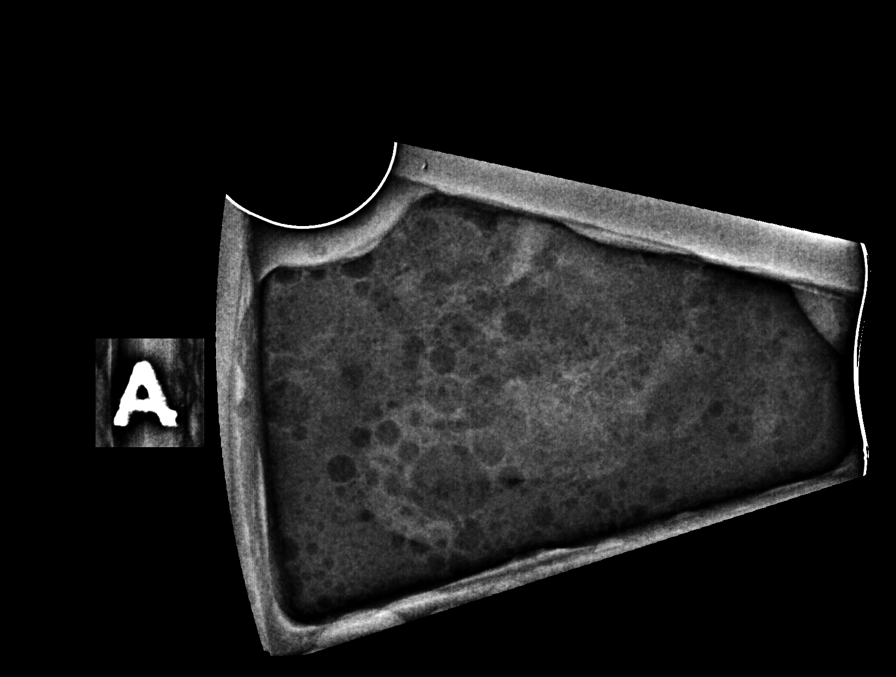

[R (5 of 8)]
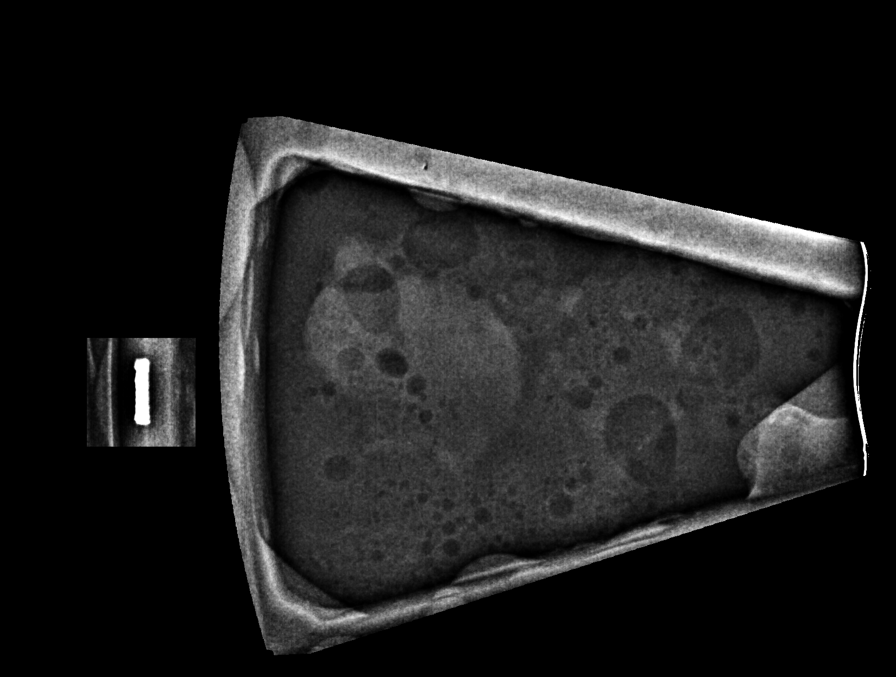

[R (6 of 8)]
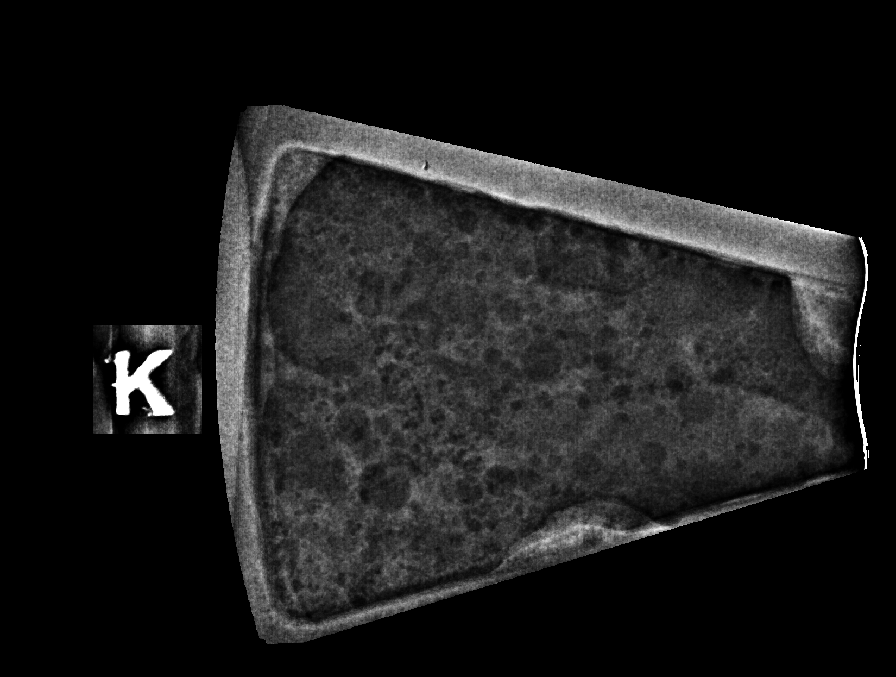

[R (7 of 8)]
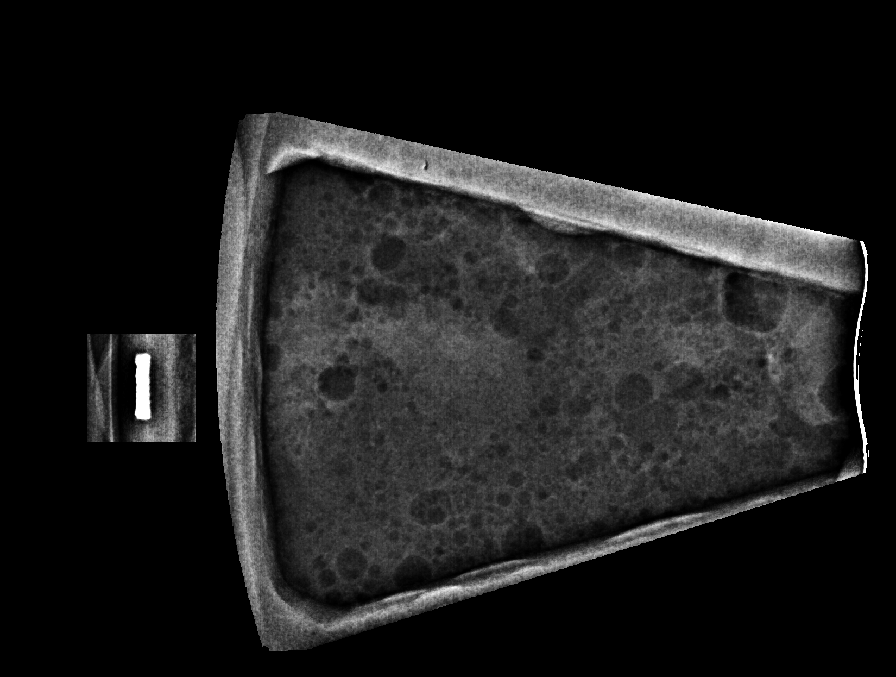

[R (8 of 8)]
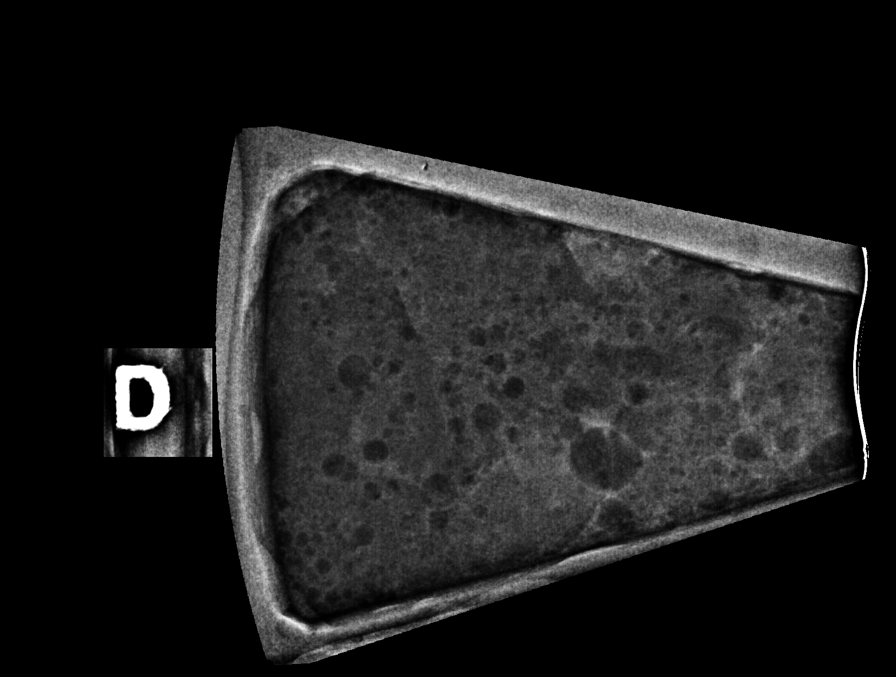

[8 of 40 positions shown; findings below may reference images not displayed]



Using sterile technique and 1% Lidocaine as local anesthetic, under
stereotactic guidance, a 9 gauge vacuum assisted device was used to
perform core needle biopsy of asymmetry in the lateral upper right
breast using a lateral approach. Specimen radiograph was not
performed.

Lesion quadrant: Lateral upper

At the conclusion of the procedure, ribbon shaped tissue marker clip
was deployed into the biopsy cavity. Follow-up 2-view mammogram was
performed and dictated separately.
IMPRESSION: Stereotactic-guided biopsy of right breast. No apparent
complications.

ADDENDUM:
PATHOLOGY revealed: A. BREAST ASYMMETRY, RIGHT UPPER LATERAL;
STEREOTACTIC BIOPSY: - BENIGN BREAST TISSUE CONSISTING OF MATURE
ADIPOSE TISSUE WITH INTERSPERSED FIBROUS STROMA, COLUMNAR CELL
CHANGE, APOCRINE METAPLASIA, NODULAR ADENOSIS, AND INVOLUTIONAL
CHANGE. - NEGATIVE FOR ATYPIA AND MALIGNANCY.

Pathology results are CONCORDANT with imaging findings, per Dr.
Ediliane Alfarth.

Pathology results and recommendations were discussed with patient
via telephone on 12/10/2021. Patient reported biopsy site doing well
with no adverse symptoms, and only slight tenderness at the site.
Post biopsy care instructions were reviewed, questions were answered
and my direct phone number was provided. Patient was instructed to
call [HOSPITAL] for any additional questions or concerns
related to biopsy site.

RECOMMENDATION: Patient instructed to continue monthly self breast
examinations and resume annual bilateral screening mammogram due
November 2022.

Pathology results reported by Davis Jim RN on 12/10/2021.



Using sterile technique and 1% Lidocaine as local anesthetic, under
stereotactic guidance, a 9 gauge vacuum assisted device was used to
perform core needle biopsy of asymmetry in the lateral upper right
breast using a lateral approach. Specimen radiograph was not
performed.

Lesion quadrant: Lateral upper

At the conclusion of the procedure, ribbon shaped tissue marker clip
was deployed into the biopsy cavity. Follow-up 2-view mammogram was
performed and dictated separately.
IMPRESSION: Stereotactic-guided biopsy of right breast. No apparent
complications.

## 2022-06-02 IMAGING — MG MM BREAST LOCALIZATION CLIP
4 series · 4 of 12 positions shown · non-contrast
Comparison: Previous exam(s).

CLINICAL DATA: Status post stereotactic guided core biopsy of right
breast asymmetry

EXAM:
3D DIAGNOSTIC RIGHT MAMMOGRAM POST stereotactic BIOPSY

[R LM synth-2D]
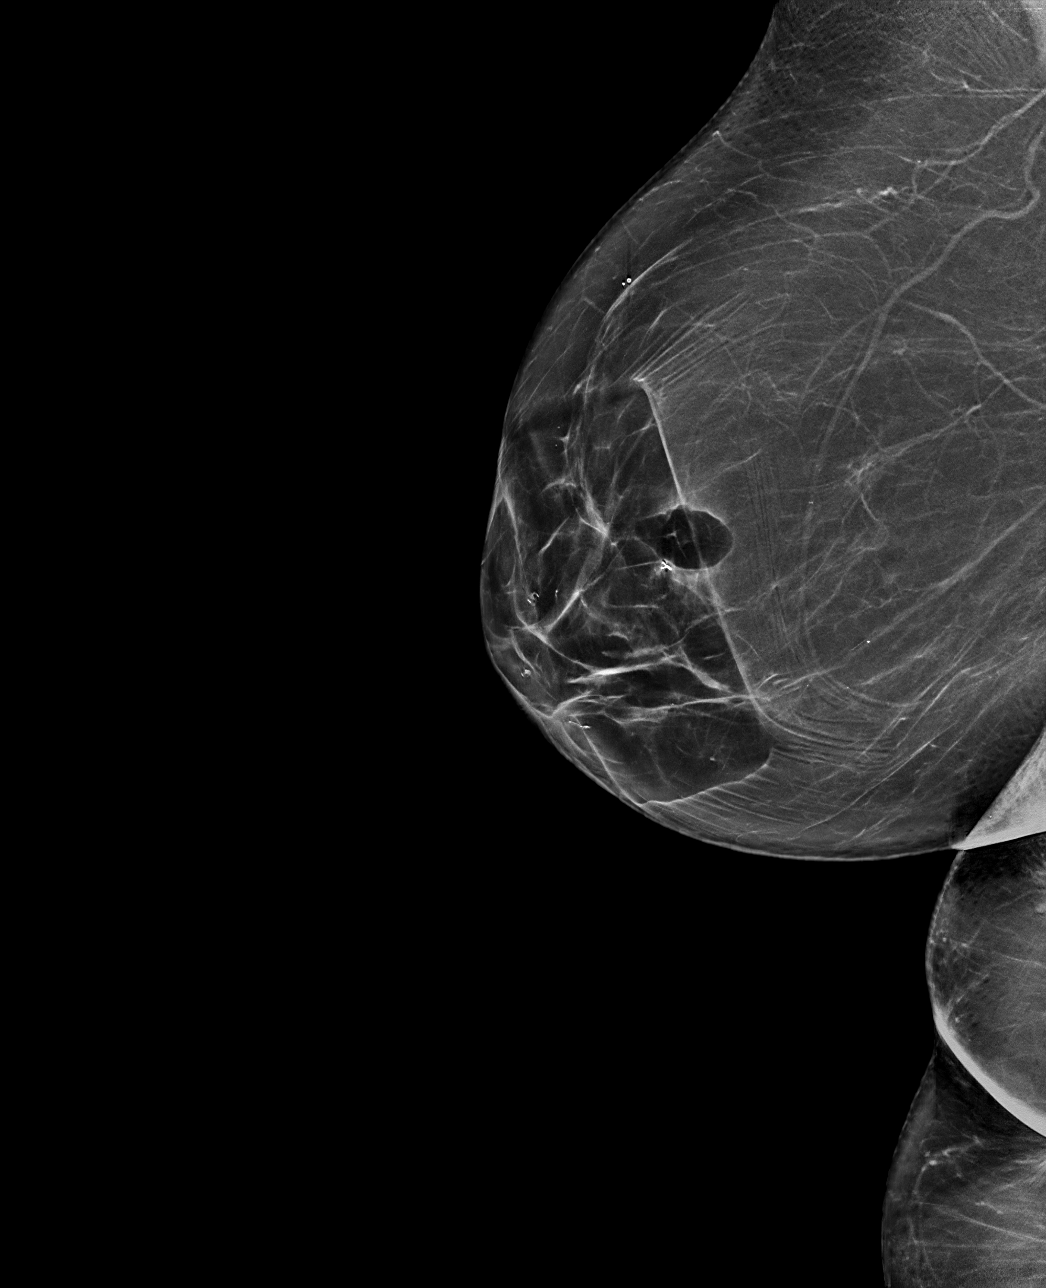

[R CC synth-2D]
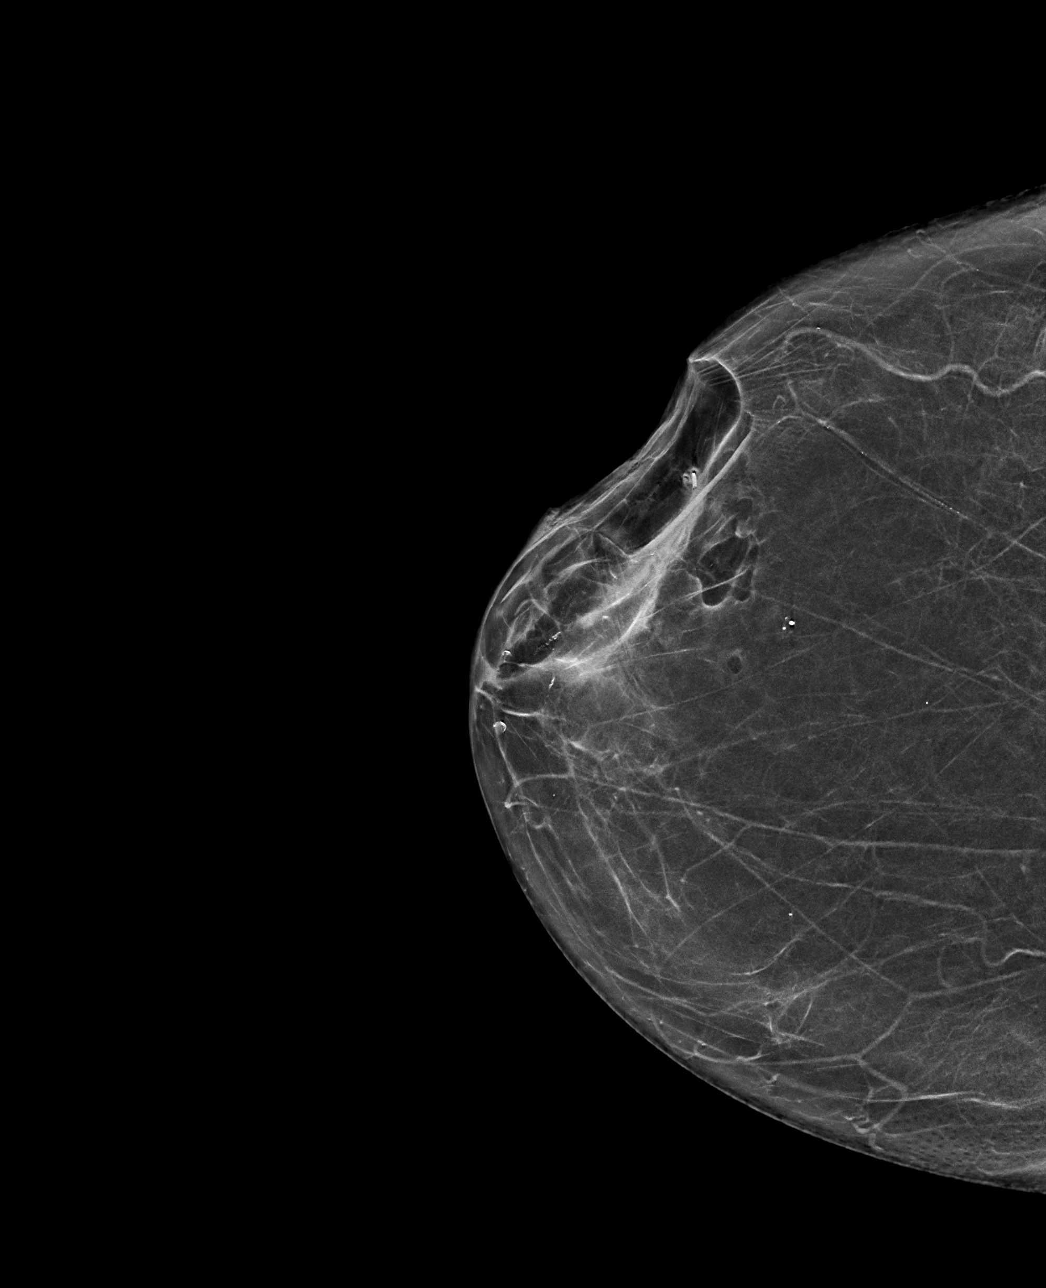

[R CC tomo · tomo slice 33/64.0]
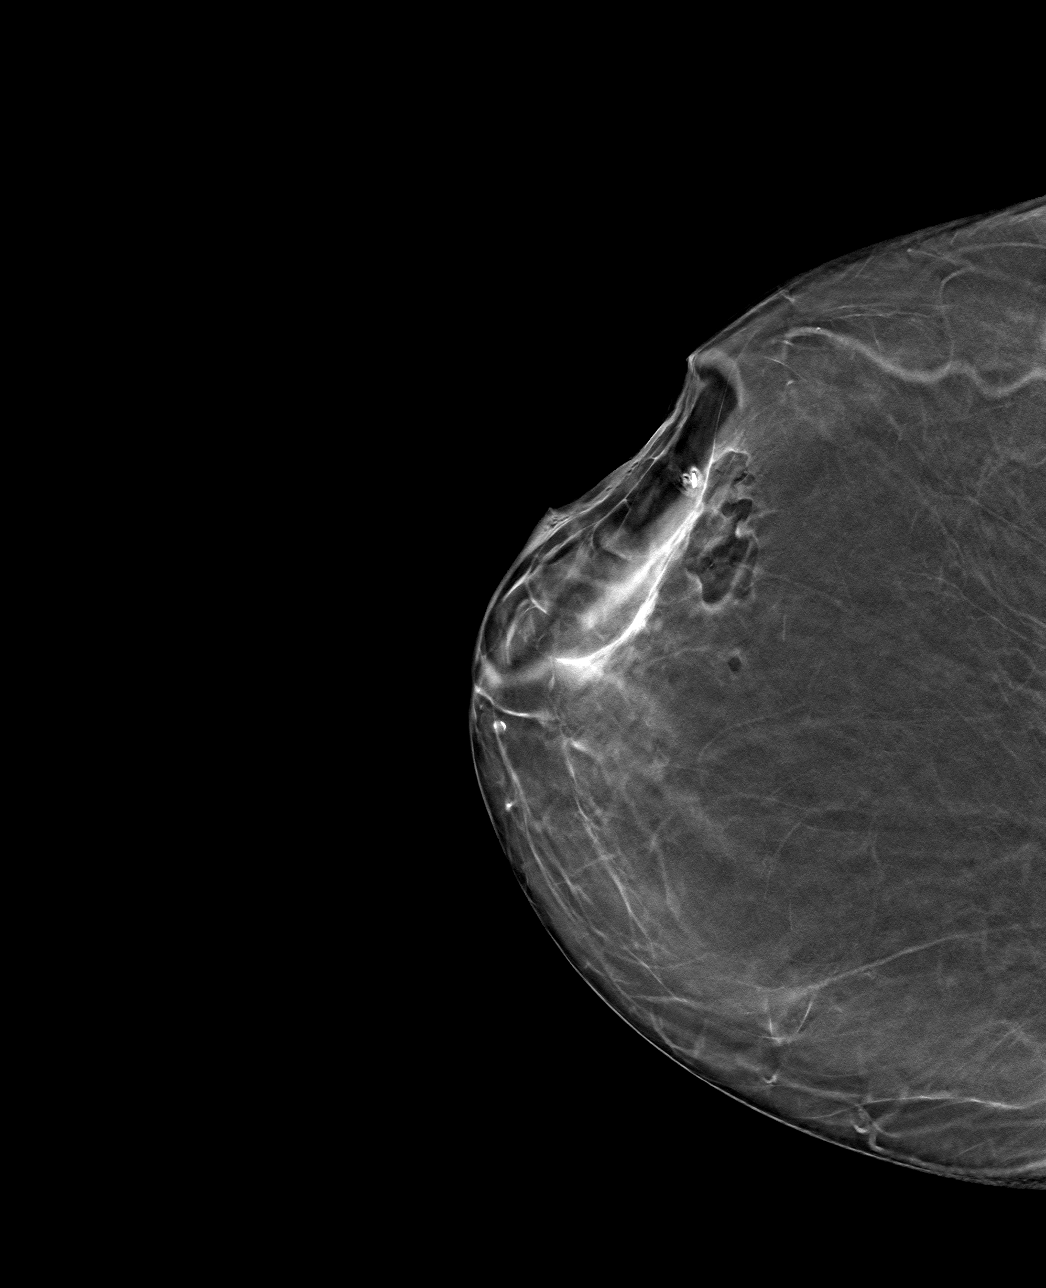

[R LM tomo · tomo slice 39/76.0]
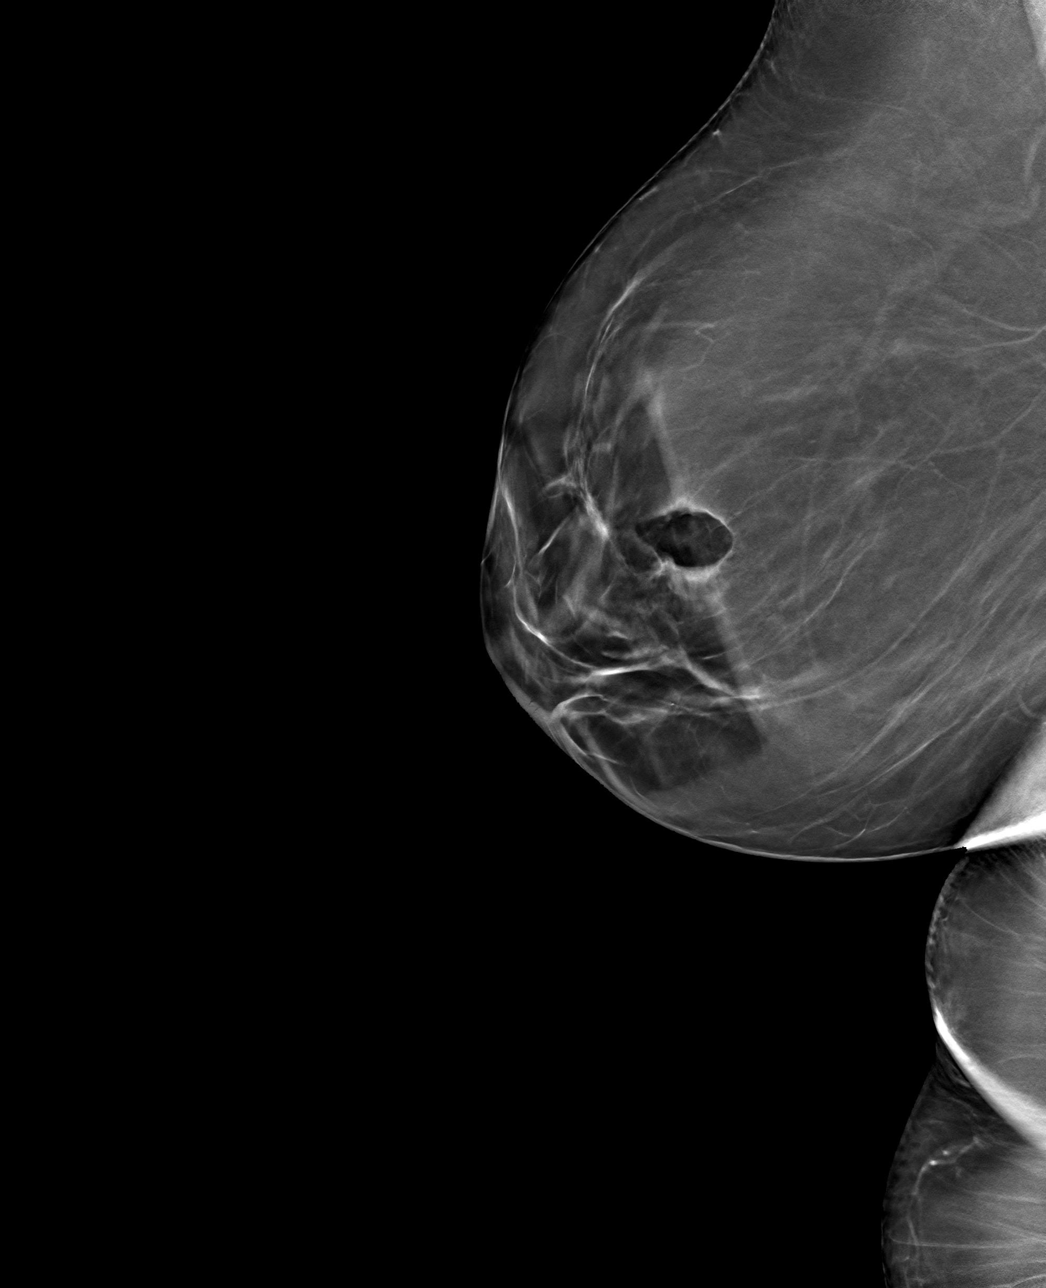

[4 of 12 positions shown; findings below may reference images not displayed]

FINDINGS: 3D Mammographic images were obtained following stereotactic guided
biopsy of asymmetry in the upper lateral right breast. The biopsy
marking clip is in expected position at the site of biopsy.
IMPRESSION: Appropriate positioning of the ribbon shaped biopsy marking clip at
the site of biopsy in the expected location of concern.

Final Assessment: Post Procedure Mammograms for Marker Placement

## 2022-08-06 DIAGNOSIS — H26492 Other secondary cataract, left eye: Secondary | ICD-10-CM | POA: Diagnosis not present

## 2022-09-15 LAB — FECAL OCCULT BLOOD, IMMUNOCHEMICAL: IFOBT: NEGATIVE

## 2022-11-03 ENCOUNTER — Encounter: Payer: Self-pay | Admitting: Family Medicine

## 2022-11-03 ENCOUNTER — Ambulatory Visit (INDEPENDENT_AMBULATORY_CARE_PROVIDER_SITE_OTHER): Payer: Medicare HMO | Admitting: Family Medicine

## 2022-11-03 VITALS — BP 120/70 | HR 88 | Ht 67.0 in | Wt 243.0 lb

## 2022-11-03 DIAGNOSIS — Z1231 Encounter for screening mammogram for malignant neoplasm of breast: Secondary | ICD-10-CM | POA: Diagnosis not present

## 2022-11-03 DIAGNOSIS — Z6838 Body mass index (BMI) 38.0-38.9, adult: Secondary | ICD-10-CM

## 2022-11-03 DIAGNOSIS — Z0000 Encounter for general adult medical examination without abnormal findings: Secondary | ICD-10-CM | POA: Diagnosis not present

## 2022-11-03 NOTE — Progress Notes (Signed)
Date:  11/03/2022   Name:  Miranda Jimenez   DOB:  14-Sep-1951   MRN:  [id number]   Chief Complaint: Annual Exam  Patient is a 72 year old female who presents for a comprehensive physical exam. The patient reports the following problems: none. Health maintenance has been reviewed declines colonoscopy.      Lab Results  Component Value Date   NA 142 10/27/2021   K 5.2 10/27/2021   CO2 23 10/27/2021   GLUCOSE 103 (H) 10/27/2021   BUN 19 10/27/2021   CREATININE 1.02 (H) 10/27/2021   CALCIUM 10.0 10/27/2021   EGFR 60 10/27/2021   GFRNONAA 61 10/23/2020   Lab Results  Component Value Date   CHOL 185 10/27/2021   HDL 57 10/27/2021   LDLCALC 112 (H) 10/27/2021   TRIG 88 10/27/2021   No results found for: "TSH" No results found for: "HGBA1C" Lab Results  Component Value Date   WBC 10.9 (H) 08/14/2015   HGB 12.4 08/14/2015   HCT 36.9 08/14/2015   MCV 95 08/14/2015   PLT 281 08/14/2015   No results found for: "ALT", "AST", "GGT", "ALKPHOS", "BILITOT" No results found for: "25OHVITD2", "25OHVITD3", "VD25OH"   Review of Systems  Constitutional: Negative.  Negative for chills, fatigue, fever and unexpected weight change.  HENT:  Negative for ear pain, rhinorrhea and trouble swallowing.   Respiratory:  Negative for cough, shortness of breath and wheezing.   Cardiovascular:  Negative for chest pain, palpitations and leg swelling.  Gastrointestinal:  Negative for abdominal pain, blood in stool and nausea.  Genitourinary:  Negative for difficulty urinating and flank pain.  Musculoskeletal:  Negative for myalgias.  Skin:  Negative for rash.  Neurological:  Negative for dizziness and headaches.  Psychiatric/Behavioral:  The patient is not nervous/anxious.     Patient Active Problem List   Diagnosis Date Noted   Primary osteoarthritis of left knee 04/26/2018   Morbid obesity with BMI of 40.0-44.9, adult ([facility]) 03/23/2018    No Known Allergies  Past Surgical History:   Procedure Laterality Date   BREAST BIOPSY Right 12/09/2021   affirm bx, ribbon marker, path pending   CATARACT EXTRACTION Bilateral    ptk surgery      Social History   Tobacco Use   Smoking status: Never   Smokeless tobacco: Never  Vaping Use   Vaping Use: Never used  Substance Use Topics   Alcohol use: No    Alcohol/week: 0.0 standard drinks of alcohol   Drug use: No     Medication list has been reviewed and updated.  No outpatient medications have been marked as taking for the 11/03/22 encounter (Office Visit) with Juline Patch, MD.   Current Facility-Administered Medications for the 11/03/22 encounter (Office Visit) with Juline Patch, MD  Medication   albuterol (PROVENTIL) (2.5 MG/3ML) 0.083% nebulizer solution 2.5 mg       11/03/2022    8:41 AM 10/27/2021    9:36 AM 10/23/2020   10:01 AM  GAD 7 : Generalized Anxiety Score  Nervous, Anxious, on Edge 0 0 0  Control/stop worrying 0 0 0  Worry too much - different things 0 0 0  Trouble relaxing 0 0 0  Restless 0 0 0  Easily annoyed or irritable 0 0 0  Afraid - awful might happen 0 0 0  Total GAD 7 Score 0 0 0  Anxiety Difficulty Not difficult at all Not difficult at all  11/03/2022    8:41 AM 10/27/2021    9:36 AM 10/23/2020   10:01 AM  Depression screen PHQ 2/9  Decreased Interest 0 0 0  Down, Depressed, Hopeless 0 0 0  PHQ - 2 Score 0 0 0  Altered sleeping 0 0 0  Tired, decreased energy 0 0 0  Change in appetite 0 0 0  Feeling bad or failure about yourself  0 0 0  Trouble concentrating 0 0 0  Moving slowly or fidgety/restless 0 0 0  Suicidal thoughts 0 0 0  PHQ-9 Score 0 0 0  Difficult doing work/chores Not difficult at all Not difficult at all     BP Readings from Last 3 Encounters:  11/03/22 120/70  10/27/21 122/80  10/23/20 138/80    Physical Exam Vitals and nursing note reviewed. Exam conducted with a chaperone present.  Constitutional:      General: She is not in acute  distress.    Appearance: She is not diaphoretic.  HENT:     Head: Normocephalic and atraumatic.     Right Ear: Tympanic membrane and external ear normal.     Left Ear: Tympanic membrane and external ear normal.     Nose: Nose normal.     Mouth/Throat:     Mouth: Mucous membranes are moist.  Eyes:     General:        Right eye: No discharge.        Left eye: No discharge.     Conjunctiva/sclera: Conjunctivae normal.     Pupils: Pupils are equal, round, and reactive to light.  Neck:     Thyroid: No thyromegaly.     Vascular: No JVD.  Cardiovascular:     Rate and Rhythm: Normal rate and regular rhythm.     Heart sounds: Normal heart sounds. No murmur heard.    No friction rub. No gallop.  Pulmonary:     Effort: Pulmonary effort is normal.     Breath sounds: Normal breath sounds. No wheezing, rhonchi or rales.  Chest:  Breasts:    Right: Normal. No swelling, bleeding, inverted nipple, mass, nipple discharge, skin change or tenderness.     Left: Normal. No swelling, bleeding, inverted nipple, mass, nipple discharge, skin change or tenderness.  Abdominal:     General: Bowel sounds are normal.     Palpations: Abdomen is soft. There is no mass.     Tenderness: There is no abdominal tenderness. There is no guarding.  Musculoskeletal:        General: Normal range of motion.     Cervical back: Normal range of motion and neck supple.  Lymphadenopathy:     Cervical: No cervical adenopathy.     Upper Body:     Right upper body: No supraclavicular or axillary adenopathy.     Left upper body: No supraclavicular or axillary adenopathy.  Skin:    General: Skin is warm and dry.  Neurological:     Mental Status: She is alert.     Wt Readings from Last 3 Encounters:  11/03/22 243 lb (110.2 kg)  10/27/21 248 lb (112.5 kg)  10/23/20 254 lb (115.2 kg)    BP 120/70   Pulse 88   Ht 5\' 7"  (1.702 m)   Wt 243 lb (110.2 kg)   SpO2 97%   BMI 38.06 kg/m   Assessment and Plan: 1. Annual  physical exam No subjective/objective concerns noted during history of present illness, review of systems, or physical exam.  Will check lipid panel and CMP for electrolytes and GFR and lipid management. - Lipid Panel With LDL/HDL Ratio - Comprehensive Metabolic Panel (CMET)  2. BMI 38.0-38.9,adult Health risks of being over weight were discussed and patient was counseled on weight loss options and exercise.  Mediterranean diet  3. Breast cancer screening by mammogram Discussed and mammogram arranged. - MM 3D SCREEN BREAST BILATERAL     Otilio Miu, MD

## 2022-11-03 NOTE — Patient Instructions (Signed)
Why follow it? Research shows. Those who follow the Mediterranean diet have a reduced risk of heart disease  The diet is associated with a reduced incidence of Parkinson's and Alzheimer's diseases People following the diet may have longer life expectancies and lower rates of chronic diseases  The Dietary Guidelines for Americans recommends the Mediterranean diet as an eating plan to promote health and prevent disease  What Is the Mediterranean Diet?  Healthy eating plan based on typical foods and recipes of Mediterranean-style cooking The diet is primarily a plant based diet; these foods should make up a majority of meals   Starches - Plant based foods should make up a majority of meals - They are an important sources of vitamins, minerals, energy, antioxidants, and fiber - Choose whole grains, foods high in fiber and minimally processed items  - Typical grain sources include wheat, oats, barley, corn, brown rice, bulgar, farro, millet, polenta, couscous  - Various types of beans include chickpeas, lentils, fava beans, black beans, white beans   Fruits  Veggies - Large quantities of antioxidant rich fruits & veggies; 6 or more servings  - Vegetables can be eaten raw or lightly drizzled with oil and cooked  - Vegetables common to the traditional Mediterranean Diet include: artichokes, arugula, beets, broccoli, brussel sprouts, cabbage, carrots, celery, collard greens, cucumbers, eggplant, kale, leeks, lemons, lettuce, mushrooms, okra, onions, peas, peppers, potatoes, pumpkin, radishes, rutabaga, shallots, spinach, sweet potatoes, turnips, zucchini - Fruits common to the Mediterranean Diet include: apples, apricots, avocados, cherries, clementines, dates, figs, grapefruits, grapes, melons, nectarines, oranges, peaches, pears, pomegranates, strawberries, tangerines  Fats - Replace butter and margarine with healthy oils, such as olive oil, canola oil, and tahini  - Limit nuts to no more than a  handful a day  - Nuts include walnuts, almonds, pecans, pistachios, pine nuts  - Limit or avoid candied, honey roasted or heavily salted nuts - Olives are central to the [employer] - can be eaten whole or used in a variety of dishes   Meats Protein - Limiting red meat: no more than a few times a month - When eating red meat: choose lean cuts and keep the portion to the size of deck of cards - Eggs: approx. 0 to 4 times a week  - Fish and lean poultry: at least 2 a week  - Healthy protein sources include, chicken, [place], lean beef, lamb - Increase intake of seafood such as tuna, salmon, trout, mackerel, shrimp, scallops - Avoid or limit high fat processed meats such as sausage and bacon  Dairy - Include moderate amounts of low fat dairy products  - Focus on healthy dairy such as fat free yogurt, skim milk, low or reduced fat cheese - Limit dairy products higher in fat such as whole or 2% milk, cheese, ice cream  Alcohol - Moderate amounts of red wine is ok  - No more than 5 oz daily for women (all ages) and men older than age 56  - No more than 10 oz of wine daily for men younger than 36  Other - Limit sweets and other desserts  - Use herbs and spices instead of salt to flavor foods  - Herbs and spices common to the traditional Mediterranean Diet include: basil, bay leaves, chives, cloves, cumin, fennel, garlic, lavender, marjoram, mint, oregano, parsley, pepper, rosemary, sage, savory, sumac, tarragon, thyme   It's not just a diet, it's a lifestyle:  The Mediterranean diet includes lifestyle factors typical of those in the  region  Foods, drinks and meals are best eaten with others and savored Daily physical activity is important for overall good health This could be strenuous exercise like running and aerobics This could also be more leisurely activities such as walking, housework, yard-work, or taking the stairs Moderation is the key; a balanced and healthy diet accommodates most  foods and drinks Consider portion sizes and frequency of consumption of certain foods   Meal Ideas & Options:  Breakfast:  Whole wheat toast or whole wheat English muffins with peanut butter & hard boiled egg Steel cut oats topped with apples & cinnamon and skim milk  Fresh fruit: banana, strawberries, melon, berries, peaches  Smoothies: strawberries, bananas, greek yogurt, peanut butter Low fat greek yogurt with blueberries and granola  Egg white omelet with spinach and mushrooms Breakfast couscous: whole wheat couscous, apricots, skim milk, cranberries  Sandwiches:  Hummus and grilled vegetables (peppers, zucchini, squash) on whole wheat bread   Grilled chicken on whole wheat pita with lettuce, tomatoes, cucumbers or tzatziki  Tuna salad on whole wheat bread: tuna salad made with greek yogurt, olives, red peppers, capers, green onions Garlic rosemary lamb pita: lamb sauted with garlic, rosemary, salt & pepper; add lettuce, cucumber, greek yogurt to pita - flavor with lemon juice and black pepper  Seafood:  Mediterranean grilled salmon, seasoned with garlic, basil, parsley, lemon juice and black pepper Shrimp, lemon, and spinach whole-grain pasta salad made with low fat greek yogurt  Seared scallops with lemon orzo  Seared tuna steaks seasoned salt, pepper, coriander topped with tomato mixture of olives, tomatoes, olive oil, minced garlic, parsley, green onions and cappers  Meats:  Herbed greek chicken salad with kalamata olives, cucumber, feta  Red bell peppers stuffed with spinach, bulgur, lean ground beef (or lentils) & topped with feta   Kebabs: skewers of chicken, tomatoes, onions, zucchini, squash  Turkey burgers: made with red onions, mint, dill, lemon juice, feta cheese topped with roasted red peppers Vegetarian Cucumber salad: cucumbers, artichoke hearts, celery, red onion, feta cheese, tossed in olive oil & lemon juice  Hummus and whole grain pita points with a greek salad  (lettuce, tomato, feta, olives, cucumbers, red onion) Lentil soup with celery, carrots made with vegetable broth, garlic, salt and pepper  Tabouli salad: parsley, bulgur, mint, scallions, cucumbers, tomato, radishes, lemon juice, olive oil, salt and pepper.      

## 2022-11-04 LAB — COMPREHENSIVE METABOLIC PANEL
ALT: 12 IU/L (ref 0–32)
AST: 16 IU/L (ref 0–40)
Albumin/Globulin Ratio: 1.8 (ref 1.2–2.2)
Albumin: 4.5 g/dL (ref 3.9–4.9)
Alkaline Phosphatase: 82 IU/L (ref 44–121)
BUN/Creatinine Ratio: 18 (ref 12–28)
BUN: 17 mg/dL (ref 8–27)
Bilirubin Total: 0.4 mg/dL (ref 0.0–1.2)
CO2: 19 mmol/L — ABNORMAL LOW (ref 20–29)
Calcium: 9.5 mg/dL (ref 8.7–10.3)
Chloride: 106 mmol/L (ref 96–106)
Creatinine, Ser: 0.97 mg/dL (ref 0.57–1.00)
Globulin, Total: 2.5 g/dL (ref 1.5–4.5)
Glucose: 109 mg/dL — ABNORMAL HIGH (ref 70–99)
Potassium: 4.5 mmol/L (ref 3.5–5.2)
Sodium: 142 mmol/L (ref 134–144)
Total Protein: 7 g/dL (ref 6.0–8.5)
eGFR: 63 mL/min/{1.73_m2} (ref >59)

## 2022-11-04 LAB — LIPID PANEL WITH LDL/HDL RATIO
Cholesterol, Total: 197 mg/dL (ref 100–199)
HDL: 58 mg/dL (ref >39)
LDL Chol Calc (NIH): 124 mg/dL — ABNORMAL HIGH (ref 0–99)
LDL/HDL Ratio: 2.1 ratio (ref 0.0–3.2)
Triglycerides: 83 mg/dL (ref 0–149)
VLDL Cholesterol Cal: 15 mg/dL (ref 5–40)

## 2022-11-30 ENCOUNTER — Ambulatory Visit
Admission: RE | Admit: 2022-11-30 | Discharge: 2022-11-30 | Disposition: A | Discharge status: Home / Self Care | Payer: Medicare HMO | Source: Ambulatory Visit | Attending: Family Medicine | Admitting: Family Medicine

## 2022-11-30 DIAGNOSIS — Z1231 Encounter for screening mammogram for malignant neoplasm of breast: Secondary | ICD-10-CM | POA: Diagnosis not present

## 2022-12-10 DIAGNOSIS — R42 Dizziness and giddiness: Secondary | ICD-10-CM

## 2022-12-10 HISTORY — DX: Dizziness and giddiness: R42

## 2023-05-27 ENCOUNTER — Encounter: Payer: Self-pay | Admitting: Family Medicine

## 2023-05-27 ENCOUNTER — Ambulatory Visit (INDEPENDENT_AMBULATORY_CARE_PROVIDER_SITE_OTHER): Payer: Medicare HMO | Admitting: Family Medicine

## 2023-05-27 ENCOUNTER — Telehealth: Payer: Self-pay

## 2023-05-27 ENCOUNTER — Telehealth: Payer: Self-pay | Admitting: Family Medicine

## 2023-05-27 VITALS — BP 120/70 | HR 90 | Ht 67.0 in | Wt 238.0 lb

## 2023-05-27 DIAGNOSIS — R194 Change in bowel habit: Secondary | ICD-10-CM | POA: Diagnosis not present

## 2023-05-27 NOTE — Telephone Encounter (Signed)
Copied from CRM [id number]. Topic: General - Other >> May 27, 2023 10:47 AM Macon Large wrote: Reason for CRM: Pt requests that Delice Bison return her call at [phone]

## 2023-05-27 NOTE — Addendum Note (Signed)
Addended by: Everitt Amber on: 05/27/2023 03:40 PM   Modules accepted: Orders

## 2023-05-27 NOTE — Telephone Encounter (Signed)
Returned pt's call- she did not answer so I left a message

## 2023-05-27 NOTE — Progress Notes (Signed)
Date:  05/27/2023   Name:  Miranda Jimenez   DOB:  1950/11/05   MRN:  [id number]   Chief Complaint: change in bowel habits (Started x 3 weeks ago. Had a tick bite around the same time. Has had soft stools x 3 weeks, ribbon shaped or pencil shaped stools. Never had colonoscopy)  GI Problem The primary symptoms include weight loss and diarrhea. Primary symptoms do not include fever, fatigue, abdominal pain, nausea, melena or hematochezia. The problem has not changed since onset. The illness does not include constipation. Associated medical issues do not include inflammatory bowel disease, liver disease or irritable bowel syndrome.    Lab Results  Component Value Date   NA 142 11/03/2022   K 4.5 11/03/2022   CO2 19 (L) 11/03/2022   GLUCOSE 109 (H) 11/03/2022   BUN 17 11/03/2022   CREATININE 0.97 11/03/2022   CALCIUM 9.5 11/03/2022   EGFR 63 11/03/2022   GFRNONAA 61 10/23/2020   Lab Results  Component Value Date   CHOL 197 11/03/2022   HDL 58 11/03/2022   LDLCALC 124 (H) 11/03/2022   TRIG 83 11/03/2022   No results found for: "TSH" No results found for: "HGBA1C" Lab Results  Component Value Date   WBC 10.9 (H) 08/14/2015   HGB 12.4 08/14/2015   HCT 36.9 08/14/2015   MCV 95 08/14/2015   PLT 281 08/14/2015   Lab Results  Component Value Date   ALT 12 11/03/2022   AST 16 11/03/2022   ALKPHOS 82 11/03/2022   BILITOT 0.4 11/03/2022   No results found for: "25OHVITD2", "25OHVITD3", "VD25OH"   Review of Systems  Constitutional:  Positive for weight loss. Negative for fatigue and fever.  Respiratory:  Negative for shortness of breath.   Cardiovascular:  Negative for chest pain and palpitations.  Gastrointestinal:  Positive for diarrhea. Negative for abdominal pain, blood in stool, constipation, hematochezia, melena and nausea.    Patient Active Problem List   Diagnosis Date Noted   Primary osteoarthritis of left knee 04/26/2018   Morbid obesity with BMI of  40.0-44.9, adult ([facility]) 03/23/2018    No Known Allergies  Past Surgical History:  Procedure Laterality Date   BREAST BIOPSY Right 12/09/2021   affirm bx, ribbon marker, path pending   CATARACT EXTRACTION Bilateral    ptk surgery      Social History   Tobacco Use   Smoking status: Never   Smokeless tobacco: Never  Vaping Use   Vaping status: Never Used  Substance Use Topics   Alcohol use: No    Alcohol/week: 0.0 standard drinks of alcohol   Drug use: No     Medication list has been reviewed and updated.  Current Meds  Medication Sig   Multiple Vitamins-Minerals (ONE-A-DAY WOMENS 50+ PO) Take 1 tablet by mouth daily.       05/27/2023    2:59 PM 11/03/2022    8:41 AM 10/27/2021    9:36 AM 10/23/2020   10:01 AM  GAD 7 : Generalized Anxiety Score  Nervous, Anxious, on Edge 0 0 0 0  Control/stop worrying 0 0 0 0  Worry too much - different things 0 0 0 0  Trouble relaxing 0 0 0 0  Restless 0 0 0 0  Easily annoyed or irritable 0 0 0 0  Afraid - awful might happen 0 0 0 0  Total GAD 7 Score 0 0 0 0  Anxiety Difficulty Not difficult at all Not difficult at all Not  difficult at all        05/27/2023    2:59 PM 11/03/2022    8:41 AM 10/27/2021    9:36 AM  Depression screen PHQ 2/9  Decreased Interest 0 0 0  Down, Depressed, Hopeless 0 0 0  PHQ - 2 Score 0 0 0  Altered sleeping 0 0 0  Tired, decreased energy 0 0 0  Change in appetite 0 0 0  Feeling bad or failure about yourself  0 0 0  Trouble concentrating 0 0 0  Moving slowly or fidgety/restless 0 0 0  Suicidal thoughts 0 0 0  PHQ-9 Score 0 0 0  Difficult doing work/chores Not difficult at all Not difficult at all Not difficult at all    BP Readings from Last 3 Encounters:  05/27/23 120/70  11/03/22 120/70  10/27/21 122/80    Physical Exam Vitals and nursing note reviewed.  HENT:     Head: Normocephalic.     Right Ear: Tympanic membrane normal.     Left Ear: Tympanic membrane normal.     Nose: Nose  normal.     Mouth/Throat:     Mouth: Mucous membranes are moist.     Tongue: No lesions.     Pharynx: Oropharynx is clear. No pharyngeal swelling.     Comments: Not pale Cardiovascular:     Chest Wall: PMI is not displaced. No thrill.     Pulses: Normal pulses.          Carotid pulses are 2+ on the right side and 2+ on the left side.      Radial pulses are 2+ on the right side and 2+ on the left side.       Femoral pulses are 2+ on the right side and 2+ on the left side.      Popliteal pulses are 2+ on the right side and 2+ on the left side.       Dorsalis pedis pulses are 2+ on the right side and 2+ on the left side.       Posterior tibial pulses are 2+ on the right side and 2+ on the left side.     Heart sounds: S1 normal and S2 normal.     No gallop. No S3 or S4 sounds.  Pulmonary:     Breath sounds: No decreased breath sounds, wheezing, rhonchi or rales.  Abdominal:     General: Bowel sounds are normal.     Palpations: Abdomen is soft. There is no hepatomegaly, splenomegaly or mass.     Tenderness: There is no right CVA tenderness or left CVA tenderness.  Genitourinary:    Rectum: Normal. Guaiac result negative. No mass or tenderness.  Musculoskeletal:     Right lower leg: No edema.     Left lower leg: No edema.  Neurological:     Mental Status: She is alert.     Wt Readings from Last 3 Encounters:  05/27/23 238 lb (108 kg)  11/03/22 243 lb (110.2 kg)  10/27/21 248 lb (112.5 kg)    BP 120/70   Pulse 90   Ht 5\' 7"  (1.702 m)   Wt 238 lb (108 kg)   SpO2 95%   BMI 37.28 kg/m   Assessment and Plan:  1. Change in bowel habit New onset.  Persistent.  Onset of [place] 3 weeks ago with change in the shape of bowel movements from the usual shape to a pencil thin and to ribbon like appearance.  There  is been no blood that has been noted and patient had 1 episode recently of fecal incontinence in which the bowel movement shot out.  There is no abdominal pain and there is no  blood there is no melena and rectal exam was negative for guaiac.  There has been weight loss but patient has said that this is by design because she has been eating well and mucous membranes appearance would not suggest anemia however we will obtain a CBC. - CBC with Differential/Platelet - Ambulatory referral to Gastroenterology    Elizabeth Sauer, MD

## 2023-05-28 LAB — CBC WITH DIFFERENTIAL/PLATELET
Basophils Absolute: 0.1 10*3/uL (ref 0.0–0.2)
Basos: 1 %
EOS (ABSOLUTE): 0.1 10*3/uL (ref 0.0–0.4)
Eos: 1 %
Hematocrit: 37.7 % (ref 34.0–46.6)
Hemoglobin: 12.1 g/dL (ref 11.1–15.9)
Immature Grans (Abs): 0 10*3/uL (ref 0.0–0.1)
Immature Granulocytes: 0 %
Lymphocytes Absolute: 2.2 10*3/uL (ref 0.7–3.1)
Lymphs: 29 %
MCH: 30.5 pg (ref 26.6–33.0)
MCHC: 32.1 g/dL (ref 31.5–35.7)
MCV: 95 fL (ref 79–97)
Monocytes Absolute: 0.8 10*3/uL (ref 0.1–0.9)
Monocytes: 10 %
Neutrophils Absolute: 4.5 10*3/uL (ref 1.4–7.0)
Neutrophils: 59 %
Platelets: 249 10*3/uL (ref 150–450)
RBC: 3.97 x10E6/uL (ref 3.77–5.28)
RDW: 12.8 % (ref 11.7–15.4)
WBC: 7.6 10*3/uL (ref 3.4–10.8)

## 2023-05-29 ENCOUNTER — Encounter: Payer: Self-pay | Admitting: Family Medicine

## 2023-05-31 ENCOUNTER — Telehealth: Payer: Self-pay | Admitting: Family Medicine

## 2023-05-31 NOTE — Telephone Encounter (Signed)
Copied from CRM [id number]. Topic: General - Inquiry >> May 31, 2023  2:28 PM De Blanch wrote: Reason for CRM:Pt stated she was asked to call back today and let Dr. Yetta Barre know if the GI Doctor had called her.  Pt stated as of today she has not heard anything yet.  Please advise.

## 2023-05-31 NOTE — Progress Notes (Unsigned)
Celso Amy, PA-C [address]  [address]  [place], [place] [address]  Main: [phone]  Fax: [phone]   Gastroenterology Consultation  Referring Provider:     [name], MD Primary Care Physician:  [name], MD Primary Gastroenterologist:  Celso Amy, PA-C / Dr. [name]   Reason for Consultation:     Change in bowel habits        HPI:   Miranda Jimenez is a 72 y.o. y/o female referred for consultation & management  by [name], MD.    Patient saw her PCP 05/27/2023 for weight loss and diarrhea.  Had change in bowel habits for 3 weeks.  She is having 1 - 2 bowel movements daily.  She has smaller pencil-sized and mushy stools.  Denies watery diarrhea.  Denies abdominal pain, nausea, vomiting, melena, or hematochezia.  Lab 06/01/2023 showed normal CBC with hemoglobin 12.1, WBC 7.6.  No recent abdominal imaging.  No previous GI evaluation. No previous colonoscopy.  Her father had colon cancer at age 29.  Past Medical History:  Diagnosis Date   Allergy     Past Surgical History:  Procedure Laterality Date   BREAST BIOPSY Right 12/09/2021   affirm bx, ribbon marker, path pending   CATARACT EXTRACTION Bilateral    ptk surgery      Prior to Admission medications   Medication Sig Start Date End Date Taking? Authorizing Provider  Multiple Vitamins-Minerals (ONE-A-DAY WOMENS 50+ PO) Take 1 tablet by mouth daily.    [provider]    Family History  Problem Relation Age of Onset   Cancer Mother        lung   Cancer Father        colon   Breast cancer Cousin        mat cousin     Social History   Tobacco Use   Smoking status: Never   Smokeless tobacco: Never  Vaping Use   Vaping status: Never Used  Substance Use Topics   Alcohol use: No    Alcohol/week: 0.0 standard drinks of alcohol   Drug use: No    Allergies as of 06/01/2023   (No Known Allergies)    Review of Systems:    All systems reviewed and  negative except where noted in HPI.   Physical Exam:  BP 138/80   Pulse 86   Temp 97.6 F (36.4 C) (Oral)   Ht 5\' 7"  (1.702 m)   Wt 237 lb (107.5 kg)   BMI 37.12 kg/m  No LMP recorded. Patient is postmenopausal.  General:   Alert,  Well-developed, obese, well-nourished, pleasant and cooperative in NAD Lungs:  Respirations even and unlabored.  Clear throughout to auscultation.   No wheezes, crackles, or rhonchi. No acute distress. Heart:  Regular rate and rhythm; no murmurs, clicks, rubs, or gallops. Abdomen:  Normal bowel sounds.  No bruits.  Soft, and obese without masses, hepatosplenomegaly or hernias noted.  No Tenderness.  No guarding or rebound tenderness.    Neurologic:  Alert and oriented x3;  grossly normal neurologically. Psych:  Alert and cooperative. Normal mood and affect.  Imaging Studies: No results found.  Assessment and Plan:   Miranda Jimenez is a 72 y.o. y/o female has been referred for:  1.  Change in bowel habits  2.  Family History of Colon Cancer - Father age 61 3.  Colon cancer screening - She has Never had a colonoscopy  Scheduling  Colonoscopy I discussed risks of colonoscopy with patient to include risk of bleeding, colon perforation, and risk of sedation.  Patient expressed understanding and agrees to proceed with colonoscopy.   Follow up As Needed based on Colon results and symptoms.  Celso Amy, PA-C

## 2023-06-01 ENCOUNTER — Ambulatory Visit: Payer: Medicare HMO | Admitting: Physician Assistant

## 2023-06-01 ENCOUNTER — Other Ambulatory Visit: Payer: Self-pay | Admitting: *Deleted

## 2023-06-01 ENCOUNTER — Encounter: Payer: Self-pay | Admitting: Physician Assistant

## 2023-06-01 VITALS — BP 138/80 | HR 86 | Temp 97.6°F | Ht 67.0 in | Wt 237.0 lb

## 2023-06-01 DIAGNOSIS — R194 Change in bowel habit: Secondary | ICD-10-CM

## 2023-06-01 DIAGNOSIS — R195 Other fecal abnormalities: Secondary | ICD-10-CM

## 2023-06-01 DIAGNOSIS — Z1211 Encounter for screening for malignant neoplasm of colon: Secondary | ICD-10-CM

## 2023-06-01 DIAGNOSIS — Z800 Family history of malignant neoplasm of digestive organs: Secondary | ICD-10-CM

## 2023-06-01 MED ORDER — PEG 3350-KCL-NABCB-NACL-NASULF 236 G PO SOLR: 4000.0000 mL | Freq: Once | ORAL | 0 refills | Status: AC

## 2023-06-20 ENCOUNTER — Encounter: Payer: Self-pay | Admitting: Gastroenterology

## 2023-07-01 ENCOUNTER — Other Ambulatory Visit: Payer: Self-pay

## 2023-07-01 ENCOUNTER — Encounter: Payer: Self-pay | Admitting: Gastroenterology

## 2023-07-01 ENCOUNTER — Ambulatory Visit: Payer: Medicare HMO | Admitting: Anesthesiology

## 2023-07-01 ENCOUNTER — Ambulatory Visit
Admission: RE | Admit: 2023-07-01 | Discharge: 2023-07-01 | Disposition: A | Discharge status: Home / Self Care | Payer: Medicare HMO | Attending: Gastroenterology | Admitting: Gastroenterology

## 2023-07-01 ENCOUNTER — Encounter
Admission: RE | Disposition: A | Discharge status: Home / Self Care | Payer: Self-pay | Source: Home / Self Care | Attending: Gastroenterology

## 2023-07-01 DIAGNOSIS — Z800 Family history of malignant neoplasm of digestive organs: Secondary | ICD-10-CM | POA: Diagnosis not present

## 2023-07-01 DIAGNOSIS — Z803 Family history of malignant neoplasm of breast: Secondary | ICD-10-CM | POA: Diagnosis not present

## 2023-07-01 DIAGNOSIS — D125 Benign neoplasm of sigmoid colon: Secondary | ICD-10-CM | POA: Insufficient documentation

## 2023-07-01 DIAGNOSIS — Z801 Family history of malignant neoplasm of trachea, bronchus and lung: Secondary | ICD-10-CM | POA: Insufficient documentation

## 2023-07-01 DIAGNOSIS — D120 Benign neoplasm of cecum: Secondary | ICD-10-CM | POA: Insufficient documentation

## 2023-07-01 DIAGNOSIS — K635 Polyp of colon: Secondary | ICD-10-CM

## 2023-07-01 DIAGNOSIS — Z1211 Encounter for screening for malignant neoplasm of colon: Secondary | ICD-10-CM | POA: Insufficient documentation

## 2023-07-01 HISTORY — PX: COLONOSCOPY WITH PROPOFOL: SHX5780

## 2023-07-01 HISTORY — PX: POLYPECTOMY: SHX5525

## 2023-07-01 SURGERY — COLONOSCOPY WITH PROPOFOL
Anesthesia: General

## 2023-07-01 MED ORDER — LIDOCAINE 2% (20 MG/ML) 5 ML SYRINGE
INTRAMUSCULAR | Status: DC | PRN
  Administered 2023-07-01: 50 mg via INTRAVENOUS

## 2023-07-01 MED ORDER — LACTATED RINGERS IV SOLN: INTRAVENOUS | Status: DC

## 2023-07-01 MED ORDER — PROPOFOL 10 MG/ML IV BOLUS
INTRAVENOUS | Status: DC | PRN
  Administered 2023-07-01: 20 mg via INTRAVENOUS
  Administered 2023-07-01: 30 mg via INTRAVENOUS
  Administered 2023-07-01: 20 mg via INTRAVENOUS
  Administered 2023-07-01: 70 mg via INTRAVENOUS
  Administered 2023-07-01: 30 mg via INTRAVENOUS
  Administered 2023-07-01: 20 mg via INTRAVENOUS
  Administered 2023-07-01: 30 mg via INTRAVENOUS

## 2023-07-01 MED ORDER — STERILE WATER FOR IRRIGATION IR SOLN
Status: DC | PRN
  Administered 2023-07-01: 1

## 2023-07-01 MED ORDER — SODIUM CHLORIDE 0.9 % IV SOLN: INTRAVENOUS | Status: DC

## 2023-07-01 SURGICAL SUPPLY — 13 items
CLIP HMST 235XBRD CATH ROT (MISCELLANEOUS) IMPLANT
CLIP RESOLUTION 360 11X235 (MISCELLANEOUS)
FORCEPS BIOP RAD 4 LRG CAP 4 (CUTTING FORCEPS) IMPLANT
GOWN CVR UNV OPN BCK APRN NK (MISCELLANEOUS) ×2 IMPLANT
GOWN ISOL THUMB LOOP REG UNIV (MISCELLANEOUS) ×2
KIT PRC NS LF DISP ENDO (KITS) ×1 IMPLANT
KIT PROCEDURE OLYMPUS (KITS) ×1
MANIFOLD NEPTUNE II (INSTRUMENTS) ×1 IMPLANT
SNARE COLD EXACTO (MISCELLANEOUS) IMPLANT
SNARE SHORT THROW 13M SML OVAL (MISCELLANEOUS) IMPLANT
SNARE SNG USE RND 15MM (INSTRUMENTS) IMPLANT
TRAP ETRAP POLY (MISCELLANEOUS) IMPLANT
WATER STERILE IRR 250ML POUR (IV SOLUTION) ×1 IMPLANT

## 2023-07-01 NOTE — Anesthesia Preprocedure Evaluation (Addendum)
Anesthesia Evaluation  Patient identified by MRN, date of birth, ID band Patient awake    Reviewed: Allergy & Precautions, H&P , NPO status , Patient's Chart, lab work & pertinent test results  Airway Mallampati: III  TM Distance: <3 FB Neck ROM: Full  Mouth opening: Limited Mouth Opening  Dental  (+) Caps Multiple caps, crowns, veneers :   Pulmonary neg pulmonary ROS   Pulmonary exam normal breath sounds clear to auscultation       Cardiovascular negative cardio ROS Normal cardiovascular exam Rhythm:Regular Rate:Normal     Neuro/Psych negative neurological ROS  negative psych ROS   GI/Hepatic negative GI ROS, Neg liver ROS,,,  Endo/Other  negative endocrine ROS    Renal/GU negative Renal ROS  negative genitourinary   Musculoskeletal negative musculoskeletal ROS (+)    Abdominal   Peds negative pediatric ROS (+)  Hematology negative hematology ROS (+)   Anesthesia Other Findings Seasonal allergies Vertigo   Reproductive/Obstetrics negative OB ROS                             Anesthesia Physical Anesthesia Plan  ASA: 1  Anesthesia Plan: General   Post-op Pain Management:    Induction: Intravenous  PONV Risk Score and Plan:   Airway Management Planned: Natural Airway and Nasal Cannula  Additional Equipment:   Intra-op Plan:   Post-operative Plan:   Informed Consent: I have reviewed the patients History and Physical, chart, labs and discussed the procedure including the risks, benefits and alternatives for the proposed anesthesia with the patient or authorized representative who has indicated his/her understanding and acceptance.     Dental Advisory Given  Plan Discussed with: Anesthesiologist, CRNA and Surgeon  Anesthesia Plan Comments: (Patient consented for risks of anesthesia including but not limited to:  - adverse reactions to medications - risk of airway  placement if required - damage to eyes, teeth, lips or other oral mucosa - nerve damage due to positioning  - sore throat or hoarseness - Damage to heart, brain, nerves, lungs, other parts of body or loss of life  Patient voiced understanding.)       Anesthesia Quick Evaluation

## 2023-07-01 NOTE — Anesthesia Postprocedure Evaluation (Signed)
Anesthesia Post Note  Patient: Miranda Jimenez  Procedure(s) Performed: COLONOSCOPY WITH PROPOFOL POLYPECTOMY  Patient location during evaluation: PACU Anesthesia Type: General Level of consciousness: awake and alert Pain management: pain level controlled Vital Signs Assessment: post-procedure vital signs reviewed and stable Respiratory status: spontaneous breathing, nonlabored ventilation, respiratory function stable and patient connected to nasal cannula oxygen Cardiovascular status: blood pressure returned to baseline and stable Postop Assessment: no apparent nausea or vomiting Anesthetic complications: no   No notable events documented.   Last Vitals:  Vitals:   07/01/23 0930 07/01/23 0934  BP:  (!) 143/65  Pulse: 80 72  Resp: 16 (!) 21  Temp:  (!) 36.1 C  SpO2: 99% 99%    Last Pain:  Vitals:   07/01/23 0934  TempSrc:   PainSc: 0-No pain                 Marisue Humble

## 2023-07-01 NOTE — H&P (Signed)
[name], MD [facility] [address], [address] [place], [place] [address] Phone: [phone] Fax : [phone]  Primary Care Physician:  [name], MD Primary Gastroenterologist:  Dr. [name]  Pre-Procedure History & Physical: HPI:  Miranda Jimenez is a 72 y.o. female is here for a screening colonoscopy.   Past Medical History:  Diagnosis Date   Allergy    Vertigo 12/2022   1 episode    Past Surgical History:  Procedure Laterality Date   BREAST BIOPSY Right 12/09/2021   affirm bx, ribbon marker, path pending   CATARACT EXTRACTION Bilateral    ptk surgery      Prior to Admission medications   Medication Sig Start Date End Date Taking? Authorizing Provider  Multiple Vitamins-Minerals (ONE-A-DAY WOMENS 50+ PO) Take 1 tablet by mouth daily.   Yes [provider]    Allergies as of 06/01/2023   (No Known Allergies)    Family History  Problem Relation Age of Onset   Cancer Mother        lung   Cancer Father        colon   Breast cancer Cousin        mat cousin    Social History   Socioeconomic History   Marital status: Widowed    Spouse name: Not on file   Number of children: 2   Years of education: Not on file   Highest education level: Some college, no degree  Occupational History   Occupation: retired  Tobacco Use   Smoking status: Never   Smokeless tobacco: Never  Vaping Use   Vaping status: Never Used  Substance and Sexual Activity   Alcohol use: No    Alcohol/week: 0.0 standard drinks of alcohol   Drug use: No   Sexual activity: Not on file  Other Topics Concern   Not on file  Social History Narrative   Lives with husband and daughter in [place], enjoys photography   Social Determinants of Health   Financial Resource Strain: Low Risk  (05/07/2019)   Overall Financial Resource Strain (CARDIA)    Difficulty of Paying Living Expenses: Not hard at all  Food Insecurity: No Food Insecurity (05/07/2019)   Hunger Vital Sign    Worried  About Running Out of Food in the Last Year: Never true    Ran Out of Food in the Last Year: Never true  Transportation Needs: No Transportation Needs (05/07/2019)   PRAPARE - Administrator, Civil Service (Medical): No    Lack of Transportation (Non-Medical): No  Physical Activity: Inactive (05/07/2019)   Exercise Vital Sign    Days of Exercise per Week: 0 days    Minutes of Exercise per Session: 0 min  Stress: No Stress Concern Present (05/07/2019)   [employer] of Occupational Health - Occupational Stress Questionnaire    Feeling of Stress : Not at all  Social Connections: Moderately Isolated (05/07/2019)   Social Connection and Isolation Panel [NHANES]    Frequency of Communication with Friends and Family: More than three times a week    Frequency of Social Gatherings with Friends and Family: Three times a week    Attends Religious Services: Never    Active Member of Clubs or Organizations: No    Attends Banker Meetings: Never    Marital Status: Married  Catering manager Violence: Not At Risk (05/07/2019)   Humiliation, Afraid, Rape, and Kick questionnaire    Fear of Current or Ex-Partner: No  Emotionally Abused: No    Physically Abused: No    Sexually Abused: No    Review of Systems: See HPI, otherwise negative ROS  Physical Exam: BP (!) 156/71   Temp 98.1 F (36.7 C) (Temporal)   Resp 15   Ht 5' 7.01" (1.702 m)   Wt 106.4 kg   SpO2 97%   BMI 36.72 kg/m  General:   Alert,  pleasant and cooperative in NAD Head:  Normocephalic and atraumatic. Neck:  Supple; no masses or thyromegaly. Lungs:  Clear throughout to auscultation.    Heart:  Regular rate and rhythm. Abdomen:  Soft, nontender and nondistended. Normal bowel sounds, without guarding, and without rebound.   Neurologic:  Alert and  oriented x4;  grossly normal neurologically.  Impression/Plan: Miranda Jimenez is now here to undergo a screening colonoscopy.  Risks, benefits,  and alternatives regarding colonoscopy have been reviewed with the patient.  Questions have been answered.  All parties agreeable.

## 2023-07-01 NOTE — Transfer of Care (Signed)
Immediate Anesthesia Transfer of Care Note  Patient: Miranda Jimenez  Procedure(s) Performed: COLONOSCOPY WITH PROPOFOL POLYPECTOMY  Patient Location: PACU  Anesthesia Type:General  Level of Consciousness: awake  Airway & Oxygen Therapy: Patient Spontanous Breathing  Post-op Assessment: Report given to RN and Post -op Vital signs reviewed and stable  Post vital signs: Reviewed and stable  Last Vitals:  Vitals Value Taken Time  BP 134/65 07/01/23 0926  Temp    Pulse 73 07/01/23 0927  Resp 23 07/01/23 0927  SpO2 99 % 07/01/23 0927  Vitals shown include unfiled device data.  Last Pain:  Vitals:   07/01/23 0812  TempSrc: Temporal  PainSc: 0-No pain         Complications: No notable events documented.

## 2023-07-01 NOTE — Op Note (Signed)
[facility] Gastroenterology Patient Name: Miranda Jimenez Procedure Date: 07/01/2023 8:50 AM MRN: [id number] Account #: [id number] Date of Birth: 1951-01-02 Admit Type: Outpatient Age: 72 Room: [facility] OR ROOM 01 Gender: Female Note Status: Finalized Instrument Name: [id number] Procedure:             Colonoscopy Indications:           Screening for colorectal malignant neoplasm Providers:             [name] MD, MD Referring MD:          [name], MD (Referring MD) Medicines:             Propofol per Anesthesia Complications:         No immediate complications. Procedure:             Pre-Anesthesia Assessment:                        - Prior to the procedure, a History and Physical was                         performed, and patient medications and allergies were                         reviewed. The patient's tolerance of previous                         anesthesia was also reviewed. The risks and benefits                         of the procedure and the sedation options and risks                         were discussed with the patient. All questions were                         answered, and informed consent was obtained. Prior                         Anticoagulants: The patient has taken no anticoagulant                         or antiplatelet agents. ASA Grade Assessment: II - A                         patient with mild systemic disease. After reviewing                         the risks and benefits, the patient was deemed in                         satisfactory condition to undergo the procedure.                        After obtaining informed consent, the colonoscope was                         passed under direct vision. Throughout the procedure,  the patient's blood pressure, pulse, and oxygen                         saturations were monitored continuously. The                         Colonoscope was introduced through the anus  and                         advanced to the the cecum, identified by appendiceal                         orifice and ileocecal valve. The colonoscopy was                         performed without difficulty. The patient tolerated                         the procedure well. The quality of the bowel                         preparation was adequate to identify polyps. Findings:      The perianal and digital rectal examinations were normal.      A 3 mm polyp was found in the cecum. The polyp was sessile. The polyp       was removed with a cold snare. Resection and retrieval were complete.      A 4 mm polyp was found in the ascending colon. The polyp was sessile.       The polyp was removed with a cold snare. Resection was complete, but the       polyp tissue was not retrieved.      A 3 mm polyp was found in the sigmoid colon. The polyp was sessile. The       polyp was removed with a cold snare. Resection and retrieval were       complete. Impression:            - One 3 mm polyp in the cecum, removed with a cold                         snare. Resected and retrieved.                        - One 4 mm polyp in the ascending colon, removed with                         a cold snare. Complete resection. Polyp tissue not                         retrieved.                        - One 3 mm polyp in the sigmoid colon, removed with a                         cold snare. Resected and retrieved. Recommendation:        - Discharge patient to home.                        -  Resume previous diet.                        - Continue present medications.                        - Await pathology results.                        - Repeat colonoscopy in 3 years for surveillance. Procedure Code(s):     --- Professional ---                        [phone], Colonoscopy, flexible; with removal of                         tumor(s), polyp(s), or other lesion(s) by snare                         technique Diagnosis Code(s):      --- Professional ---                        Z12.11, Encounter for screening for malignant neoplasm                         of colon                        D12.2, Benign neoplasm of ascending colon CPT copyright 2022 American Medical Association. All rights reserved. The codes documented in this report are preliminary and upon coder review may  be revised to meet current compliance requirements. [name] MD, MD 07/01/2023 9:25:10 AM This report has been signed electronically. Number of Addenda: 0 Note Initiated On: 07/01/2023 8:50 AM Scope Withdrawal Time: 0 hours 13 minutes 27 seconds  Total Procedure Duration: 0 hours 19 minutes 17 seconds  Estimated Blood Loss:  Estimated blood loss: none.      [facility]

## 2023-07-04 ENCOUNTER — Encounter: Payer: Self-pay | Admitting: Gastroenterology

## 2023-07-05 ENCOUNTER — Encounter: Payer: Self-pay | Admitting: Gastroenterology

## 2023-07-05 LAB — SURGICAL PATHOLOGY

## 2023-08-12 DIAGNOSIS — H26492 Other secondary cataract, left eye: Secondary | ICD-10-CM | POA: Diagnosis not present

## 2023-08-12 DIAGNOSIS — H43813 Vitreous degeneration, bilateral: Secondary | ICD-10-CM | POA: Diagnosis not present

## 2023-08-12 DIAGNOSIS — Z961 Presence of intraocular lens: Secondary | ICD-10-CM | POA: Diagnosis not present

## 2023-11-07 ENCOUNTER — Ambulatory Visit (INDEPENDENT_AMBULATORY_CARE_PROVIDER_SITE_OTHER): Payer: Medicare HMO | Admitting: Family Medicine

## 2023-11-07 ENCOUNTER — Encounter: Payer: Self-pay | Admitting: Family Medicine

## 2023-11-07 VITALS — BP 128/62 | HR 82 | Ht 67.01 in | Wt 230.2 lb

## 2023-11-07 DIAGNOSIS — Z0000 Encounter for general adult medical examination without abnormal findings: Secondary | ICD-10-CM | POA: Diagnosis not present

## 2023-11-07 NOTE — Progress Notes (Signed)
Date:  11/07/2023   Name:  Miranda Jimenez   DOB:  Mar 20, 1951   MRN:  [id number]   Chief Complaint: Annual Exam  Patient is a 73 year old female who presents for a comprehensive physical exam. The patient reports the following problems: upto date.Marland [facility] Health maintenance has been reviewed up to date.      Lab Results  Component Value Date   NA 142 11/03/2022   K 4.5 11/03/2022   CO2 19 (L) 11/03/2022   GLUCOSE 109 (H) 11/03/2022   BUN 17 11/03/2022   CREATININE 0.97 11/03/2022   CALCIUM 9.5 11/03/2022   EGFR 63 11/03/2022   GFRNONAA 61 10/23/2020   Lab Results  Component Value Date   CHOL 197 11/03/2022   HDL 58 11/03/2022   LDLCALC 124 (H) 11/03/2022   TRIG 83 11/03/2022   No results found for: "TSH" No results found for: "HGBA1C" Lab Results  Component Value Date   WBC 7.6 05/27/2023   HGB 12.1 05/27/2023   HCT 37.7 05/27/2023   MCV 95 05/27/2023   PLT 249 05/27/2023   Lab Results  Component Value Date   ALT 12 11/03/2022   AST 16 11/03/2022   ALKPHOS 82 11/03/2022   BILITOT 0.4 11/03/2022   No results found for: "25OHVITD2", "25OHVITD3", "VD25OH"   Review of Systems  Constitutional: Negative.  Negative for chills, fatigue, fever and unexpected weight change.  HENT:  Negative for congestion, ear discharge, ear pain, rhinorrhea, sinus pressure, sneezing and sore throat.   Respiratory:  Negative for cough, shortness of breath, wheezing and stridor.   Gastrointestinal:  Negative for abdominal pain, blood in stool, constipation, diarrhea and nausea.  Genitourinary:  Negative for dysuria, flank pain, frequency, hematuria, urgency and vaginal discharge.  Musculoskeletal:  Negative for arthralgias, back pain and myalgias.  Skin:  Negative for rash.  Neurological:  Negative for dizziness, weakness and headaches.  Hematological:  Negative for adenopathy. Does not bruise/bleed easily.  Psychiatric/Behavioral:  Negative for dysphoric mood. The patient is not  nervous/anxious.     Patient Active Problem List   Diagnosis Date Noted   Colon cancer screening 07/01/2023   Polyp of ascending colon 07/01/2023   Primary osteoarthritis of left knee 04/26/2018   Morbid obesity with BMI of 40.0-44.9, adult (HCC) 03/23/2018    No Known Allergies  Past Surgical History:  Procedure Laterality Date   BREAST BIOPSY Right 12/09/2021   affirm bx, ribbon marker, path pending   CATARACT EXTRACTION Bilateral    COLONOSCOPY WITH PROPOFOL N/A 07/01/2023   Procedure: COLONOSCOPY WITH PROPOFOL;  Surgeon: [name], MD;  Location: [facility];  Service: Endoscopy;  Laterality: N/A;   EYE SURGERY     Cataracts   POLYPECTOMY N/A 07/01/2023   Procedure: POLYPECTOMY;  Surgeon: [name], MD;  Location: [facility];  Service: Endoscopy;  Laterality: N/A;  ASCENDING COLON POLYP NOT RETRIEVED.   ptk surgery      Social History   Tobacco Use   Smoking status: Never   Smokeless tobacco: Never  Vaping Use   Vaping status: Never Used  Substance Use Topics   Alcohol use: Never   Drug use: Never     Medication list has been reviewed and updated.  No outpatient medications have been marked as taking for the 11/07/23 encounter (Office Visit) with [name], MD.       11/07/2023    8:40 AM 05/27/2023    2:59 PM 11/03/2022  8:41 AM 10/27/2021    9:36 AM  GAD 7 : Generalized Anxiety Score  Nervous, Anxious, on Edge 0 0 0 0  Control/stop worrying 0 0 0 0  Worry too much - different things 0 0 0 0  Trouble relaxing 0 0 0 0  Restless 0 0 0 0  Easily annoyed or irritable 0 0 0 0  Afraid - awful might happen 0 0 0 0  Total GAD 7 Score 0 0 0 0  Anxiety Difficulty Not difficult at all Not difficult at all Not difficult at all Not difficult at all       11/07/2023    8:40 AM 05/27/2023    2:59 PM 11/03/2022    8:41 AM  Depression screen PHQ 2/9  Decreased Interest 0 0 0  Down, Depressed, Hopeless 0 0 0  PHQ - 2 Score 0 0 0   Altered sleeping 0 0 0  Tired, decreased energy 0 0 0  Change in appetite 0 0 0  Feeling bad or failure about yourself   0 0  Trouble concentrating 0 0 0  Moving slowly or fidgety/restless 0 0 0  Suicidal thoughts 0 0 0  PHQ-9 Score 0 0 0  Difficult doing work/chores Not difficult at all Not difficult at all Not difficult at all    BP Readings from Last 3 Encounters:  11/07/23 128/62  07/01/23 (!) 143/65  06/01/23 138/80    Physical Exam Vitals and nursing note reviewed.  Constitutional:      General: She is not in acute distress.    Appearance: She is not diaphoretic.  HENT:     Head: Normocephalic and atraumatic.     Right Ear: Tympanic membrane, ear canal and external ear normal. There is no impacted cerumen.     Left Ear: Tympanic membrane, ear canal and external ear normal. There is no impacted cerumen.     Nose: Nose normal. No congestion or rhinorrhea.     Mouth/Throat:     Mouth: Mucous membranes are moist.  Eyes:     General:        Right eye: No discharge.        Left eye: No discharge.     Conjunctiva/sclera: Conjunctivae normal.     Pupils: Pupils are equal, round, and reactive to light.  Neck:     Thyroid: No thyromegaly.     Vascular: No JVD.  Cardiovascular:     Rate and Rhythm: Normal rate and regular rhythm.     Heart sounds: Normal heart sounds. No murmur heard.    No friction rub. No gallop.  Pulmonary:     Effort: Pulmonary effort is normal.     Breath sounds: Normal breath sounds. No wheezing, rhonchi or rales.  Chest:     Chest wall: No tenderness.  Abdominal:     General: Bowel sounds are normal. There is no distension.     Palpations: Abdomen is soft. There is no mass.     Tenderness: There is no abdominal tenderness. There is no right CVA tenderness, left CVA tenderness, guarding or rebound.  Musculoskeletal:        General: Normal range of motion.     Cervical back: Normal range of motion and neck supple.  Lymphadenopathy:      Cervical: No cervical adenopathy.  Skin:    General: Skin is warm and dry.  Neurological:     Mental Status: She is alert.     Motor: No weakness.  Coordination: Coordination normal.     Deep Tendon Reflexes: Reflexes are normal and symmetric.     Wt Readings from Last 3 Encounters:  11/07/23 230 lb 3.2 oz (104.4 kg)  07/01/23 234 lb 8 oz (106.4 kg)  06/01/23 237 lb (107.5 kg)    BP 128/62   Pulse 82   Ht 5' 7.01" (1.702 m)   Wt 230 lb 3.2 oz (104.4 kg)   SpO2 98%   BMI 36.04 kg/m   Assessment and Plan: 1. Annual physical exam (Primary) No subjective/objective concerns noted during HPI, review of past medical history/medications/within the past year labs, review of systems and physical exam. - Lipid Panel With LDL/HDL Ratio - Comprehensive metabolic panel     Elizabeth Sauer, MD

## 2023-11-08 ENCOUNTER — Encounter: Payer: Self-pay | Admitting: Family Medicine

## 2023-11-08 LAB — COMPREHENSIVE METABOLIC PANEL
ALT: 13 [IU]/L (ref 0–32)
AST: 16 [IU]/L (ref 0–40)
Albumin: 4.5 g/dL (ref 3.9–4.9)
Alkaline Phosphatase: 90 [IU]/L (ref 44–121)
BUN/Creatinine Ratio: 15 (ref 12–28)
BUN: 17 mg/dL (ref 8–27)
Bilirubin Total: 0.5 mg/dL (ref 0.0–1.2)
CO2: 21 mmol/L (ref 20–29)
Calcium: 9.8 mg/dL (ref 8.7–10.3)
Chloride: 103 mmol/L (ref 96–106)
Creatinine, Ser: 1.13 mg/dL — ABNORMAL HIGH (ref 0.57–1.00)
Globulin, Total: 2.8 g/dL (ref 1.5–4.5)
Glucose: 99 mg/dL (ref 70–99)
Potassium: 4.8 mmol/L (ref 3.5–5.2)
Sodium: 140 mmol/L (ref 134–144)
Total Protein: 7.3 g/dL (ref 6.0–8.5)
eGFR: 52 mL/min/{1.73_m2} — ABNORMAL LOW (ref >59)

## 2023-11-08 LAB — LIPID PANEL WITH LDL/HDL RATIO
Cholesterol, Total: 189 mg/dL (ref 100–199)
HDL: 49 mg/dL (ref >39)
LDL Chol Calc (NIH): 119 mg/dL — ABNORMAL HIGH (ref 0–99)
LDL/HDL Ratio: 2.4 {ratio} (ref 0.0–3.2)
Triglycerides: 117 mg/dL (ref 0–149)
VLDL Cholesterol Cal: 21 mg/dL (ref 5–40)

## 2024-08-24 DIAGNOSIS — Z961 Presence of intraocular lens: Secondary | ICD-10-CM | POA: Diagnosis not present

## 2024-08-24 DIAGNOSIS — H26492 Other secondary cataract, left eye: Secondary | ICD-10-CM | POA: Diagnosis not present

## 2024-08-24 DIAGNOSIS — H18593 Other hereditary corneal dystrophies, bilateral: Secondary | ICD-10-CM | POA: Diagnosis not present

## 2024-09-13 NOTE — Progress Notes (Unsigned)
 Miranda Jimenez                                          MRN: [id number]   09/13/2024   The [facility] Quality Team Specialist reviewed this patient medical record for the purposes of chart review for care gap closure. The following were reviewed: {CHL AMB [facility] QUALITY SPECIALIST REASON FOR REVIEW:[id number]}.    [facility] Quality Team

## 2024-09-19 ENCOUNTER — Encounter: Payer: Self-pay | Admitting: Student

## 2024-09-19 ENCOUNTER — Ambulatory Visit (INDEPENDENT_AMBULATORY_CARE_PROVIDER_SITE_OTHER): Admitting: Student

## 2024-09-19 VITALS — BP 118/76 | HR 85 | Ht 67.0 in | Wt 240.0 lb

## 2024-09-19 DIAGNOSIS — Z6837 Body mass index (BMI) 37.0-37.9, adult: Secondary | ICD-10-CM | POA: Diagnosis not present

## 2024-09-19 DIAGNOSIS — Z860101 Personal history of adenomatous and serrated colon polyps: Secondary | ICD-10-CM | POA: Diagnosis not present

## 2024-09-19 DIAGNOSIS — E7800 Pure hypercholesterolemia, unspecified: Secondary | ICD-10-CM | POA: Diagnosis not present

## 2024-09-19 DIAGNOSIS — E785 Hyperlipidemia, unspecified: Secondary | ICD-10-CM | POA: Insufficient documentation

## 2024-09-19 NOTE — Assessment & Plan Note (Signed)
 Associated with HLD. Weigh today is 340, reports about 6 pound weight gain in the last few weeks after she started eating more fast food and sweetened beverages. Reports she gardens regularly but otherwise does not doe dedicated exercise. She will work on incorporating regular exercise, will try silver sneakers. Discussed reducing saturated foots and sugary foods and beverages.

## 2024-09-19 NOTE — Assessment & Plan Note (Addendum)
 The 10-year ASCVD risk score (Arnett DK, et al., 2019) is: 9.1%.  Not currently on statin or lipid lowering medication. She would like to work on dietary and exercise modification and repeat lipid at next visit. Will discuss statin at next visit if LDL remains elevated.

## 2024-09-19 NOTE — Assessment & Plan Note (Signed)
 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

## 2024-09-19 NOTE — Progress Notes (Signed)
 Established Patient Office Visit  Subjective   Patient ID: Miranda Jimenez, female    DOB: 1951/08/13  Age: 73 y.o. MRN: [id number]  Chief Complaint  Patient presents with   Establish Care    Miranda Jimenez is a 73 y.o. person with medical hx listed below who presents today for transfer of care. Previously seeing Dr. Joshua until she retired. Feeling well today without acute complaints today.  Patient Active Problem List   Diagnosis Date Noted   HLD (hyperlipidemia) 09/19/2024   Hx of adenomatous colonic polyps 09/19/2024   BMI 37.0-37.9, adult 09/19/2024   Primary osteoarthritis of left knee 04/26/2018   Morbid obesity (HCC) 03/23/2018      ROS Refer to HPI    Objective:     No outpatient encounter medications on file as of 09/19/2024.   No facility-administered encounter medications on file as of 09/19/2024.    BP 118/76   Pulse 85   Ht 5' 7 (1.702 m)   Wt 240 lb (108.9 kg)   SpO2 96%   BMI 37.59 kg/m  BP Readings from Last 3 Encounters:  09/19/24 118/76  11/07/23 128/62  07/01/23 (!) 143/65    Physical Exam Constitutional:      Appearance: Normal appearance.  HENT:     Head: Normocephalic and atraumatic.  Cardiovascular:     Rate and Rhythm: Normal rate and regular rhythm.     Pulses: Normal pulses.     Heart sounds: No murmur heard. Pulmonary:     Effort: Pulmonary effort is normal.     Breath sounds: No rhonchi or rales.  Abdominal:     General: Abdomen is flat. Bowel sounds are normal. There is no distension.     Palpations: Abdomen is soft.     Tenderness: There is no abdominal tenderness.  Musculoskeletal:        General: Normal range of motion.     Right lower leg: No edema.     Left lower leg: No edema.  Skin:    General: Skin is warm and dry.     Capillary Refill: Capillary refill takes less than 2 seconds.  Neurological:     General: No focal deficit present.     Mental Status: She is alert and oriented to person, place, and  time.  Psychiatric:        Mood and Affect: Mood normal.        Behavior: Behavior normal.        09/19/2024    9:19 AM 11/07/2023    8:40 AM 05/27/2023    2:59 PM  Depression screen PHQ 2/9  Decreased Interest 0 0 0  Down, Depressed, Hopeless 0 0 0  PHQ - 2 Score 0 0 0  Altered sleeping 0 0 0  Tired, decreased energy 0 0 0  Change in appetite 0 0 0  Feeling bad or failure about yourself  0  0  Trouble concentrating 0 0 0  Moving slowly or fidgety/restless 0 0 0  Suicidal thoughts 0 0 0  PHQ-9 Score 0 0  0   Difficult doing work/chores Not difficult at all Not difficult at all Not difficult at all     Data saved with a previous flowsheet row definition       09/19/2024    9:19 AM 11/07/2023    8:40 AM 05/27/2023    2:59 PM 11/03/2022    8:41 AM  GAD 7 : Generalized Anxiety Score  Nervous, Anxious, on  Edge 0 0 0 0  Control/stop worrying 0 0 0 0  Worry too much - different things 0 0 0 0  Trouble relaxing 0 0 0 0  Restless 0 0 0 0  Easily annoyed or irritable 0 0 0 0  Afraid - awful might happen 0 0 0 0  Total GAD 7 Score 0 0 0 0  Anxiety Difficulty Not difficult at all Not difficult at all Not difficult at all Not difficult at all    No results found for any visits on 09/19/24.  Last CBC Lab Results  Component Value Date   WBC 7.6 05/27/2023   HGB 12.1 05/27/2023   HCT 37.7 05/27/2023   MCV 95 05/27/2023   MCH 30.5 05/27/2023   RDW 12.8 05/27/2023   PLT 249 05/27/2023   Last metabolic panel Lab Results  Component Value Date   GLUCOSE 99 11/07/2023   NA 140 11/07/2023   K 4.8 11/07/2023   CL 103 11/07/2023   CO2 21 11/07/2023   BUN 17 11/07/2023   CREATININE 1.13 (H) 11/07/2023   EGFR 52 (L) 11/07/2023   CALCIUM 9.8 11/07/2023   PHOS 3.4 10/27/2021   PROT 7.3 11/07/2023   ALBUMIN 4.5 11/07/2023   LABGLOB 2.8 11/07/2023   AGRATIO 1.8 11/03/2022   BILITOT 0.5 11/07/2023   ALKPHOS 90 11/07/2023   AST 16 11/07/2023   ALT 13 11/07/2023   Last  lipids Lab Results  Component Value Date   CHOL 189 11/07/2023   HDL 49 11/07/2023   LDLCALC 119 (H) 11/07/2023   TRIG 117 11/07/2023      The 10-year ASCVD risk score (Arnett DK, et al., 2019) is: 9.1%    Assessment & Plan:  Pure hypercholesterolemia Assessment & Plan: The 10-year ASCVD risk score (Arnett DK, et al., 2019) is: 9.1%.  Not currently on statin or lipid lowering medication. She would like to work on dietary and exercise modification and repeat lipid at next visit. Will discuss statin at next visit if LDL remains elevated.    Hx of adenomatous colonic polyps Assessment & Plan: Last colonoscopy 07/01/2023, TA of the cecum and sigmoid colon  on pathology, recommended 3 year follow up in 2027   Morbid obesity (HCC) Assessment & Plan: Associated with HLD. Weigh today is 340, reports about 6 pound weight gain in the last few weeks after she started eating more fast food and sweetened beverages. Reports she gardens regularly but otherwise does not doe dedicated exercise. She will work on incorporating regular exercise, will try silver sneakers. Discussed reducing saturated foots and sugary foods and beverages.    BMI 37.0-37.9, adult     Return in about 4 weeks (around 10/17/2024) for physical.    Harlene Saddler, MD

## 2024-11-08 ENCOUNTER — Encounter: Admitting: Student

## 2024-11-15 ENCOUNTER — Encounter: Admitting: Student

## 2024-11-21 ENCOUNTER — Encounter: Admitting: Student
